# Patient Record
Sex: Female | Born: 1945 | Race: White | Hispanic: No | Marital: Single | Smoking: Never smoker
Health system: Southern US, Community
[De-identification: ages and names within clinical notes are randomized; demographics above are authoritative.]

## PROBLEM LIST (undated history)

## (undated) DIAGNOSIS — E039 Hypothyroidism, unspecified: Secondary | ICD-10-CM

## (undated) DIAGNOSIS — F419 Anxiety disorder, unspecified: Secondary | ICD-10-CM

## (undated) DIAGNOSIS — I10 Essential (primary) hypertension: Secondary | ICD-10-CM

## (undated) DIAGNOSIS — N939 Abnormal uterine and vaginal bleeding, unspecified: Secondary | ICD-10-CM

## (undated) DIAGNOSIS — H269 Unspecified cataract: Secondary | ICD-10-CM

## (undated) DIAGNOSIS — W19XXXA Unspecified fall, initial encounter: Secondary | ICD-10-CM

## (undated) HISTORY — PX: TONSILLECTOMY: SUR1361

## (undated) HISTORY — PX: APPENDECTOMY: SHX54

## (undated) HISTORY — DX: Unspecified cataract: H26.9

---

## 1972-07-06 DIAGNOSIS — F064 Anxiety disorder due to known physiological condition: Secondary | ICD-10-CM | POA: Insufficient documentation

## 2015-12-05 DIAGNOSIS — W19XXXA Unspecified fall, initial encounter: Secondary | ICD-10-CM

## 2015-12-05 HISTORY — DX: Unspecified fall, initial encounter: W19.XXXA

## 2016-01-03 ENCOUNTER — Emergency Department (HOSPITAL_COMMUNITY): Payer: Medicare Other

## 2016-01-03 ENCOUNTER — Encounter (HOSPITAL_COMMUNITY): Payer: Self-pay

## 2016-01-03 ENCOUNTER — Emergency Department (HOSPITAL_COMMUNITY)
Admission: EM | Admit: 2016-01-03 | Discharge: 2016-01-03 | Disposition: A | Discharge status: Home / Self Care | Payer: Medicare Other | Attending: Emergency Medicine | Admitting: Emergency Medicine

## 2016-01-03 DIAGNOSIS — S79911A Unspecified injury of right hip, initial encounter: Secondary | ICD-10-CM | POA: Diagnosis present

## 2016-01-03 DIAGNOSIS — E876 Hypokalemia: Secondary | ICD-10-CM

## 2016-01-03 DIAGNOSIS — Y9389 Activity, other specified: Secondary | ICD-10-CM | POA: Insufficient documentation

## 2016-01-03 DIAGNOSIS — Y999 Unspecified external cause status: Secondary | ICD-10-CM | POA: Insufficient documentation

## 2016-01-03 DIAGNOSIS — Y929 Unspecified place or not applicable: Secondary | ICD-10-CM | POA: Diagnosis not present

## 2016-01-03 DIAGNOSIS — W11XXXA Fall on and from ladder, initial encounter: Secondary | ICD-10-CM | POA: Diagnosis not present

## 2016-01-03 DIAGNOSIS — S3210XA Unspecified fracture of sacrum, initial encounter for closed fracture: Secondary | ICD-10-CM | POA: Diagnosis not present

## 2016-01-03 DIAGNOSIS — S32591A Other specified fracture of right pubis, initial encounter for closed fracture: Secondary | ICD-10-CM

## 2016-01-03 DIAGNOSIS — S32431A Displaced fracture of anterior column [iliopubic] of right acetabulum, initial encounter for closed fracture: Secondary | ICD-10-CM | POA: Diagnosis not present

## 2016-01-03 DIAGNOSIS — S52591A Other fractures of lower end of right radius, initial encounter for closed fracture: Secondary | ICD-10-CM | POA: Diagnosis not present

## 2016-01-03 DIAGNOSIS — S32401A Unspecified fracture of right acetabulum, initial encounter for closed fracture: Secondary | ICD-10-CM

## 2016-01-03 HISTORY — DX: Unspecified fall, initial encounter: W19.XXXA

## 2016-01-03 LAB — CBC
HEMATOCRIT: 32.2 % — AB (ref 36.0–46.0)
Hemoglobin: 9.7 g/dL — ABNORMAL LOW (ref 12.0–15.0)
MCH: 22.8 pg — ABNORMAL LOW (ref 26.0–34.0)
MCHC: 30.1 g/dL (ref 30.0–36.0)
MCV: 75.6 fL — ABNORMAL LOW (ref 78.0–100.0)
PLATELETS: 204 10*3/uL (ref 150–400)
RBC: 4.26 MIL/uL (ref 3.87–5.11)
RDW: 17.4 % — AB (ref 11.5–15.5)
WBC: 12.5 10*3/uL — AB (ref 4.0–10.5)

## 2016-01-03 LAB — BASIC METABOLIC PANEL
ANION GAP: 10 (ref 5–15)
BUN: 12 mg/dL (ref 6–20)
CALCIUM: 9.2 mg/dL (ref 8.9–10.3)
CO2: 23 mmol/L (ref 22–32)
CREATININE: 0.76 mg/dL (ref 0.44–1.00)
Chloride: 107 mmol/L (ref 101–111)
Glucose, Bld: 109 mg/dL — ABNORMAL HIGH (ref 65–99)
Potassium: 3.4 mmol/L — ABNORMAL LOW (ref 3.5–5.1)
SODIUM: 140 mmol/L (ref 135–145)

## 2016-01-03 MED ORDER — POTASSIUM CHLORIDE CRYS ER 20 MEQ PO TBCR
40.0000 meq | EXTENDED_RELEASE_TABLET | Freq: Once | ORAL | Status: AC
  Administered 2016-01-03: 40 meq via ORAL
  Filled 2016-01-03: qty 2

## 2016-01-03 MED ORDER — ACETAMINOPHEN 500 MG PO TABS
1000.0000 mg | ORAL_TABLET | Freq: Once | ORAL | Status: AC
  Administered 2016-01-03: 1000 mg via ORAL
  Filled 2016-01-03: qty 2

## 2016-01-03 MED ORDER — DOCUSATE SODIUM 100 MG PO CAPS: 100.0000 mg | ORAL_CAPSULE | Freq: Two times a day (BID) | ORAL | Status: DC

## 2016-01-03 MED ORDER — OXYCODONE HCL 5 MG PO TABS
5.0000 mg | ORAL_TABLET | Freq: Once | ORAL | Status: AC
  Administered 2016-01-03: 5 mg via ORAL
  Filled 2016-01-03: qty 1

## 2016-01-03 MED ORDER — OXYCODONE HCL 5 MG PO TABS: 5.0000 mg | ORAL_TABLET | Freq: Four times a day (QID) | ORAL | Status: DC | PRN

## 2016-01-03 NOTE — ED Notes (Signed)
Pt transported to and from radiology on stretcher with tech, tolerated well.  Pt reports no pain while lying supine.  Family seated with pt.

## 2016-01-03 NOTE — ED Provider Notes (Signed)
CSN: [id number]     Arrival date & time 01/03/16  0612 History   First MD Initiated Contact with Patient 01/03/16 [phone]     Chief Complaint  Patient presents with  . Fall     (Consider location/radiation/quality/duration/timing/severity/associated sxs/prior Treatment) The history is provided by the patient and medical records. No language interpreter was used.     Betty Sanchez is a 70 y.o. female  with a hx of recurrent falls presents to the Emergency Department complaining of acute, persistent, Right hip pain onset Wednesday after a falling 8 feet off a ladder while power washing.  Pt reports she has been able to ambulate with some pain.  This morning she slipped on a rug and fell, but did not strike her hip and has since had more difficulty bearing weight due to the pain.  Pt has taken ibuprofen with moderate relief.  She took 1 vicodin this AM with increased improvement in pain.  Pt denies numbness, weakness, paresthesias.      Past Medical History  Diagnosis Date  . Fall 12/2015    multiple falls in week    Past Surgical History  Procedure Laterality Date  . Tonsillectomy     No family history on file. Social History  Substance Use Topics  . Smoking status: Never Smoker   . Smokeless tobacco: None  . Alcohol Use: No   OB History    No data available     Review of Systems  Constitutional: Negative for fever, diaphoresis, appetite change, fatigue and unexpected weight change.  HENT: Negative for mouth sores.   Eyes: Negative for visual disturbance.  Respiratory: Negative for cough, chest tightness, shortness of breath and wheezing.   Cardiovascular: Negative for chest pain.  Gastrointestinal: Negative for nausea, vomiting, abdominal pain, diarrhea and constipation.  Endocrine: Negative for polydipsia, polyphagia and polyuria.  Genitourinary: Negative for dysuria, urgency, frequency and hematuria.  Musculoskeletal: Positive for back pain ( right SI joint) and arthralgias  ( right hip). Negative for neck stiffness.  Skin: Negative for rash.  Allergic/Immunologic: Negative for immunocompromised state.  Neurological: Negative for syncope, light-headedness and headaches.  Hematological: Does not bruise/bleed easily.  Psychiatric/Behavioral: Negative for sleep disturbance. The patient is not nervous/anxious.       Allergies  Review of patient's allergies indicates no known allergies.  Home Medications   Prior to Admission medications   Medication Sig Start Date End Date Taking? Authorizing Provider  docusate sodium (COLACE) 100 MG capsule Take 1 capsule (100 mg total) by mouth every 12 (twelve) hours. 01/03/16   Rourke Mcquitty, PA-C  oxyCODONE (ROXICODONE) 5 MG immediate release tablet Take 1 tablet (5 mg total) by mouth every 6 (six) hours as needed for severe pain. 01/03/16   Ally Knodel, PA-C   BP 161/91 mmHg  Pulse 90  Temp(Src) 98 F (36.7 C)  Resp 18  Ht 5\' 7"  (1.702 m)  Wt 77.111 kg  BMI 26.62 kg/m2  SpO2 99% Physical Exam  Constitutional: She appears well-developed and well-nourished. No distress.  Awake, alert, nontoxic appearance  HENT:  Head: Normocephalic and atraumatic.  Mouth/Throat: Oropharynx is clear and moist. No oropharyngeal exudate.  Eyes: Conjunctivae are normal. No scleral icterus.  Neck: Normal range of motion. Neck supple.  Cardiovascular: Normal rate, regular rhythm and intact distal pulses.   Pulmonary/Chest: Effort normal and breath sounds normal. No respiratory distress. She has no wheezes.  Equal chest expansion  Abdominal: Soft. Bowel sounds are normal. She exhibits no mass. There is  no tenderness. There is no rebound and no guarding.  Musculoskeletal: Normal range of motion. She exhibits no edema.  Right hip: almost full ROM with pain; ecchymosis over the right greater trochanter; 5/5 strength with flexion/extension in a lying position; no swelling or palpable deformity RLE: FROM of the right knee, ankle  and foot; sensation intact to dull and sharp throughout; 5/5 with flexion/extension of the knee and ankle  Neurological: She is alert.  Speech is clear and goal oriented Moves extremities without ataxia Gait not tested  Skin: Skin is warm and dry. She is not diaphoretic.  Psychiatric: She has a normal mood and affect.  Nursing note and vitals reviewed.   ED Course  Procedures (including critical care time)  I have personally reviewed and evaluated these images and lab results as part of my medical decision-making.  Results for orders placed or performed during the hospital encounter of 01/03/16  CBC  Result Value Ref Range   WBC 12.5 (H) 4.0 - 10.5 K/uL   RBC 4.26 3.87 - 5.11 MIL/uL   Hemoglobin 9.7 (L) 12.0 - 15.0 g/dL   HCT 32.2 (L) 36.0 - 46.0 %   MCV 75.6 (L) 78.0 - 100.0 fL   MCH 22.8 (L) 26.0 - 34.0 pg   MCHC 30.1 30.0 - 36.0 g/dL   RDW 17.4 (H) 11.5 - 15.5 %   Platelets 204 150 - 400 K/uL  Basic metabolic panel  Result Value Ref Range   Sodium 140 135 - 145 mmol/L   Potassium 3.4 (L) 3.5 - 5.1 mmol/L   Chloride 107 101 - 111 mmol/L   CO2 23 22 - 32 mmol/L   Glucose, Bld 109 (H) 65 - 99 mg/dL   BUN 12 6 - 20 mg/dL   Creatinine, Ser 0.76 0.44 - 1.00 mg/dL   Calcium 9.2 8.9 - 10.3 mg/dL   GFR calc non Af Amer >60 >60 mL/min   GFR calc Af Amer >60 >60 mL/min   Anion gap 10 5 - 15   Dg Lumbar Spine Complete  01/03/2016  CLINICAL DATA:  Fall 2 days ago.  Right low back pain.  Hip pain. EXAM: LUMBAR SPINE - COMPLETE 4+ VIEW COMPARISON:  Right hip radiographs from the same day. FINDINGS: Five non rib-bearing lumbar type vertebral bodies are present. Moderate facet degenerative changes are present at L4-5 and L5-S1. There is chronic loss of disc height at L3-4, L4-5, and L5-S1. No acute fracture traumatic subluxation is present. IMPRESSION: 1. Moderate degenerative changes in the lower lumbar spine. 2. No acute abnormality of the lumbar spine. Electronically Signed   By:  San Morelle M.D.   On: 01/03/2016 08:17   Ct Hip Right Wo Contrast  01/03/2016  CLINICAL DATA:  Right acetabular fracture.  Right leg pain. EXAM: CT OF THE RIGHT HIP WITHOUT CONTRAST TECHNIQUE: Multidetector CT imaging of the right hip was performed according to the standard protocol. Multiplanar CT image reconstructions were also generated. COMPARISON:  None. FINDINGS: Bones/Joint/Cartilage Comminuted right anterior column fracture with 4 mm of displacement along the medial acetabular roof. Comminuted and displaced right inferior pubic ramus fracture. Nondisplaced fracture at the junction of the acetabulum and right superior pubic ramus. Nondisplaced right pubic body fracture. Nondisplaced right sacral ala fracture. No other fracture or dislocation. Mild degenerative changes of the visualize right SI joint. Normal alignment. No aggressive lytic or sclerotic osseous lesion. Mild degenerative disc disease with minimal disc height loss at L4-5 and L5-S1 with bilateral facet arthropathy. Muscles  Normal. Soft tissue No fluid collection or hematoma.  No soft tissue mass. IMPRESSION: 1. Comminuted right anterior column fracture with 4 mm of displaced along the medial acetabular roof. 2. Comminuted and displaced right inferior pubic ramus fracture. Nondisplaced fracture at the junction of the acetabulum and right superior pubic ramus. Nondisplaced right pubic body fracture. 3. Nondisplaced right sacral ala fracture. Electronically Signed   By: Kathreen Devoid   On: 01/03/2016 09:36   Dg Hip Unilat  With Pelvis 2-3 Views Right  01/03/2016  CLINICAL DATA:  Right hip pain after fall 3 days ago. EXAM: DG HIP (WITH OR WITHOUT PELVIS) 2-3V RIGHT COMPARISON:  None. FINDINGS: Nondisplaced fracture is seen involving the right inferior pubic ramus. Mildly displaced fracture is noted involving the superior acetabulum. Right femoral head and neck are unremarkable. No dislocation is noted. IMPRESSION: Nondisplaced fracture  involving right inferior pubic ramus. Mildly displaced fracture involving superior margin of right acetabulum. Electronically Signed   By: Marijo Conception, M.D.   On: 01/03/2016 07:31    MDM   Final diagnoses:  Acetabular fracture, right, closed, initial encounter  Closed fracture of multiple pubic rami, right, initial encounter ([place])   Aliviana Rojero presents with several days of right hip pain after falling approx 8 feet.  Pt able to weight bear until this AM.  Plain film with nondisplaced fracture involving right inferior pubic ramus. Mildly displaced fracture involving superior margin of right acetabulum.  Will CT.  Pt resting comfortably.    10:46 AM CT shows closed acetabular fracture of the medial roof, right inferior pubic ramus fracture and right sacral ala fracture.  Pt seen by Dr. Venora Maples who discussed the imaging with Dr. Percell Miller who recommends touch down weight bearing with a walker and outpatient management.  Discussed with case management for home DME (walker, shower stool, bedside commode).  Home health with deliver equipment to the ED.    The patient was discussed with and seen by Dr. Venora Maples who agrees with the treatment plan.   Betty Soho Ridhi Hoffert, PA-C 01/03/16 [place], MD 01/03/16 [phone]

## 2016-01-03 NOTE — ED Notes (Signed)
Bedside commode placed, pt able to stand, pivot and sit unassisted.  Pt tolerated well.

## 2016-01-03 NOTE — Care Management Note (Signed)
Case Management Note  Patient Details  Name: Betty Sanchez MRN: [id number] Date of Birth: [birth year]  Subjective/Objective:                  a 70 y.o. female with a hx of recurrent falls presents to the Emergency Department complaining of acute, persistent, Right hip pain onset Wednesday after a falling 8 feet off a ladder while power washing./From home alone.  Action/Plan: Follow for disposition needs./ Set up DME.   Expected Discharge Date:      01/03/16            Expected Discharge Plan:  Home/Self Care  In-House Referral:     Discharge planning Services  CM Consult  Post Acute Care Choice:  Durable Medical Equipment Choice offered to:  Patient  DME Arranged:  3-N-1, Shower stool, Walker rolling DME Agency:  [place]:  NA [place] Agency:  NA  Status of Service:  Completed, signed off  If discussed at [place] of Stay Meetings, dates discussed:    Additional Comments: Fuller Mandril, RN, BSN, [place] [phone]. Pt qualifies for DME rolling walker, shower chair and 3n1.  DME  ordered through [place].  Melene Muller of [facility] notified to deliver DME to pt room prior to D/C home.  Fuller Mandril, RN 01/03/2016, 11:01 AM

## 2016-01-03 NOTE — Discharge Instructions (Signed)
1. Medications: Oxycodone as needed for severe or breakthrough pain; tylenol 1g every 6-8 hours (up to 4g in 24 hours) for initial pain control, take colace with oxycodone; usual home medications 2. Treatment: rest, ice, use walker and other home equipment; touch down weight bearing 3. Follow Up: Please followup with orthopedics by calling this afternoon to set up an appointment for discussion of your diagnoses and further evaluation after today's visit; if you do not have a primary care doctor use the resource guide provided to find one; Please return to the ER for worsening symptoms or other concerns

## 2016-01-03 NOTE — ED Notes (Signed)
Pt arrives via EMS for fall x2 this week, one mechanical fall on Wednesday while pressure washing and was able to ambulate on it, then tripped this morning and then had difficulty bearing weight on R hip. No bruising noted.

## 2016-01-03 NOTE — ED Notes (Signed)
Patient transported to X-ray 

## 2016-01-03 NOTE — ED Notes (Signed)
PA at bedside.

## 2017-07-06 DIAGNOSIS — H269 Unspecified cataract: Secondary | ICD-10-CM

## 2017-07-06 HISTORY — PX: CATARACT EXTRACTION W/ INTRAOCULAR LENS  IMPLANT, BILATERAL: SHX1307

## 2017-07-06 HISTORY — DX: Unspecified cataract: H26.9

## 2017-12-10 ENCOUNTER — Telehealth: Payer: Self-pay | Admitting: Family Medicine

## 2017-12-10 NOTE — Telephone Encounter (Signed)
FYI See below message    Copied from [place] [id number]. Topic: Appointment Scheduling - New Patient >> Dec 10, 2017 11:44 AM Ether Griffins B wrote: New patient has been scheduled for your office. Provider: Carlean Purl Date of Appointment: 12/27/17  Route to department's [facility] pool.  >> Dec 10, 2017 12:25 PM Helene Shoe, LPN wrote: Nicole Kindred RN with [facility] said this pt was advised earlier today by surgery center BP  170/120 and pts sister took her to [facility] walk in for eval. This is just FYI.

## 2017-12-10 NOTE — Telephone Encounter (Signed)
Noted  

## 2017-12-10 NOTE — Telephone Encounter (Signed)
Pt has new pt appt on 12/27/17 with Glenda Chroman FNP. FYI to Glenda Chroman FNP.

## 2017-12-10 NOTE — Telephone Encounter (Signed)
Patients sister  Jacqlyn Larsen called giving information  on the patient . Patient has an appointment at June 24 just made with Tor Netters by the [facility] agent   to establish care . Pt was at the surgical center in  [place]  this am for cataract surgery  and her blood pressure was elevated according to agent who took the call it the bp was reported at 237/115 today . And the surgery was rescheduled due to her blood pressure. The sister  took the patient to the [place] clinic and the patient is being checked in to be seen right now. Jacqlyn Larsen is in the car in the parking lot of [facility] giving this information and she stated I have to leave now she is walking out of the bldg. Sister Jacqlyn Larsen then hung up. I called Becky  back and she stated  the patient was  letting her know that they were going to do more more test. The patient and her sister are now in the [place] clinic being evaluated.Spoke  With Rena just to advise her.

## 2017-12-22 ENCOUNTER — Encounter: Payer: Self-pay | Admitting: Family Medicine

## 2017-12-27 ENCOUNTER — Ambulatory Visit: Payer: Medicare Other | Admitting: Family Medicine

## 2017-12-27 ENCOUNTER — Encounter: Payer: Self-pay | Admitting: Family Medicine

## 2017-12-27 VITALS — BP 168/108 | HR 135 | Temp 98.7°F | Ht 64.5 in | Wt 174.0 lb

## 2017-12-27 DIAGNOSIS — D582 Other hemoglobinopathies: Secondary | ICD-10-CM

## 2017-12-27 DIAGNOSIS — F418 Other specified anxiety disorders: Secondary | ICD-10-CM | POA: Diagnosis not present

## 2017-12-27 DIAGNOSIS — I10 Essential (primary) hypertension: Secondary | ICD-10-CM

## 2017-12-27 DIAGNOSIS — Z7689 Persons encountering health services in other specified circumstances: Secondary | ICD-10-CM

## 2017-12-27 MED ORDER — AMLODIPINE BESYLATE 10 MG PO TABS: 10.0000 mg | ORAL_TABLET | Freq: Every day | ORAL | 3 refills | Status: DC

## 2017-12-27 MED ORDER — CLONAZEPAM 1 MG PO TABS: 0.5000 mg | ORAL_TABLET | Freq: Two times a day (BID) | ORAL | 0 refills | Status: DC | PRN

## 2017-12-27 NOTE — Patient Instructions (Addendum)
Apps for relaxation- Calm, Headspace  Take two of your amlodipine 5 mg. I have sent in a new prescription for the 10 mg dose.   Take blood pressure every other day at different times of day  I will notify you of your lab results  Please follow up with me in 9-11 days (next Wed. Or Friday)       How to Take Your Blood Pressure You can take your blood pressure at home with a machine. You may need to check your blood pressure at home:  To check if you have high blood pressure (hypertension).  To check your blood pressure over time.  To make sure your blood pressure medicine is working.  Supplies needed: You will need a blood pressure machine, or monitor. You can buy one at a drugstore or online. When choosing one:  Choose one with an arm cuff.  Choose one that wraps around your upper arm. Only one finger should fit between your arm and the cuff.  Do not choose one that measures your blood pressure from your wrist or finger.  Your doctor can suggest a monitor. How to prepare Avoid these things for 30 minutes before checking your blood pressure:  Drinking caffeine.  Drinking alcohol.  Eating.  Smoking.  Exercising.  Five minutes before checking your blood pressure:  Pee.  Sit in a dining chair. Avoid sitting in a soft couch or armchair.  Be quiet. Do not talk.  How to take your blood pressure Follow the instructions that came with your machine. If you have a digital blood pressure monitor, these may be the instructions: 1. Sit up straight. 2. Place your feet on the floor. Do not cross your ankles or legs. 3. Rest your left arm at the level of your heart. You may rest it on a table, desk, or chair. 4. Pull up your shirt sleeve. 5. Wrap the blood pressure cuff around the upper part of your left arm. The cuff should be 1 inch (2.5 cm) above your elbow. It is best to wrap the cuff around bare skin. 6. Fit the cuff snugly around your arm. You should be able to  place only one finger between the cuff and your arm. 7. Put the cord inside the groove of your elbow. 8. Press the power button. 9. Sit quietly while the cuff fills with air and loses air. 10. Write down the numbers on the screen. 11. Wait 2-3 minutes and then repeat steps 1-10.  What do the numbers mean? Two numbers make up your blood pressure. The first number is called systolic pressure. The second is called diastolic pressure. An example of a blood pressure reading is "120 over 80" (or 120/80). If you are an adult and do not have a medical condition, use this guide to find out if your blood pressure is normal: Normal  First number: below 120.  Second number: below 80. Elevated  First number: 120-129.  Second number: below 80. Hypertension stage 1  First number: 130-139.  Second number: 80-89. Hypertension stage 2  First number: 140 or above.  Second number: 34 or above. Your blood pressure is above normal even if only the top or bottom number is above normal. Follow these instructions at home:  Check your blood pressure as often as your doctor tells you to.  Take your monitor to your next doctor's appointment. Your doctor will: ? Make sure you are using it correctly. ? Make sure it is working right.  Make  sure you understand what your blood pressure numbers should be.  Tell your doctor if your medicines are causing side effects. Contact a doctor if:  Your blood pressure keeps being high. Get help right away if:  Your first blood pressure number is higher than 180.  Your second blood pressure number is higher than 120. This information is not intended to replace advice given to you by your health care provider. Make sure you discuss any questions you have with your health care provider. Document Released: 06/04/2008 Document Revised: 05/20/2016 Document Reviewed: 11/29/2015 Elsevier Interactive Patient Education  [employer].

## 2017-12-27 NOTE — Progress Notes (Signed)
Subjective:    Patient ID: Betty Sanchez, female    DOB: May 28, 1946, 72 y.o.   MRN: [id number]  HPI This is a 72 yo female who presents today to establish care. 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 Enjoys golfing, travel, reading.   She has not had regular primary care. 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   HTN- She was scheduled to have right cataract removed and when she presented to surgery center on 12/10/17, her blood pressure was very elevated and surgery was postponed. She went immediately to urgent care where blood work, EKG and CXR were done. At [facility], her blood pressure was 206/133, HR 120. She was started on amlodipine 5 mg.  She started amlodipine and denies any side effects. She has a home blood pressure monitor and has been monitoring her blood pressure at least daily with readings ranging from 130-160/80-90s.   Anxiety- she reports that she does not typically have anxiety, only related to her health. Fears the unknown.   Last CPE- many years Mammo- unable to tolerate due to anxiety Colonoscopy- never Eye- regular  No headaches, visual changes with cataract, no dizziness, no CP or SOB unless anxiety attack. No abdominal pain, nausea/vomiting/diarrhea, some recent constipation. No muscle or joint pain. Recent sleep disturbance with elevated blood pressure due to anxiety.    Past Medical History:  Diagnosis Date  . Fall 12/2015   multiple falls in week    Past Surgical History:  Procedure Laterality Date  . TONSILLECTOMY     No family history on file. Social History   Tobacco Use  . Smoking status: Never Smoker  . Smokeless tobacco: Never Used  Substance Use Topics  . Alcohol use: No  . Drug use: Never      Review of Systems Per HPI    Objective:   Physical Exam Physical Exam  Constitutional: Oriented to person, place, and time. She appears  well-developed and well-nourished.  HENT:  Head: Normocephalic and atraumatic.  Eyes: Conjunctivae are normal.  Neck: Normal range of motion. Neck supple.  Cardiovascular: tachycardic, regular rhythm and normal heart sounds.   Pulmonary/Chest: Effort normal and breath sounds normal.  Musculoskeletal: Normal range of motion.  Neurological: Alert and oriented to person, place, and time.  Skin: Skin is warm and dry.  Psychiatric: Normal mood and affect. Behavior is normal. Judgment and thought content normal.  Vitals reviewed.     BP (!) 168/108 (BP Location: Right Arm, Patient Position: Sitting, Cuff Size: Large)   Pulse (!) 135   Temp 98.7 F (37.1 C) (Oral)   Ht 5' 4.5" (1.638 m)   Wt 174 lb (78.9 kg)   SpO2 97%   BMI 29.41 kg/m  Wt Readings from Last 3 Encounters:  12/27/17 174 lb (78.9 kg)  01/03/16 170 lb (77.1 kg)   Depression screen [facility] 2/9 12/27/2017  Decreased Interest 0  PHQ - 2 Score 0       Assessment & Plan:  1. Encounter to establish care - reviewed available records in Care Everywhere, nml TSH, HgbA1C, CMET, EKG. Hgb/Hct elevated 17.1/50.4, UA with blood and ketones, CXR- unable to view report but provider note indicates calcifications, right hemidiaphram.  - will follow up on CXR, urine at future visit and address overdue health maintenance  2. Essential hypertension - remains elevated in office, home readings generally WNL. Will increase amlodipine  to 10 mg and have her follow up next week.  - amLODipine (NORVASC) 10 MG tablet; Take 1 tablet (10 mg total) by mouth daily.  Dispense: 90 tablet; Refill: 3 - follow up next week with readings  3. Elevated hemoglobin (HCC) - CBC with Differential - Iron, TIBC and Ferritin Panel  4. Anxiety about health - discussed rare use of benzo for medical visits/procedures - encouraged her to explore mindfulness/meditation - clonazePAM (KLONOPIN) 1 MG tablet; Take 0.5-1 tablets (0.5-1 mg total) by mouth 2 (two) times  daily as needed for anxiety.  Dispense: 15 tablet; Refill: 0   Clarene Reamer, FNP-BC  [facility]  12/28/2017 8:10 AM

## 2017-12-28 ENCOUNTER — Encounter: Payer: Self-pay | Admitting: Family Medicine

## 2017-12-28 LAB — CBC WITH DIFFERENTIAL/PLATELET
BASOS PCT: 0.6 % (ref 0.0–3.0)
Basophils Absolute: 0.1 10*3/uL (ref 0.0–0.1)
EOS ABS: 0 10*3/uL (ref 0.0–0.7)
Eosinophils Relative: 0.4 % (ref 0.0–5.0)
HEMATOCRIT: 47.6 % — AB (ref 36.0–46.0)
Hemoglobin: 16.5 g/dL — ABNORMAL HIGH (ref 12.0–15.0)
LYMPHS ABS: 1.4 10*3/uL (ref 0.7–4.0)
Lymphocytes Relative: 15.7 % (ref 12.0–46.0)
MCHC: 34.7 g/dL (ref 30.0–36.0)
MCV: 89.6 fl (ref 78.0–100.0)
MONO ABS: 0.7 10*3/uL (ref 0.1–1.0)
Monocytes Relative: 7.9 % (ref 3.0–12.0)
Neutro Abs: 6.7 10*3/uL (ref 1.4–7.7)
Neutrophils Relative %: 75.4 % (ref 43.0–77.0)
PLATELETS: 292 10*3/uL (ref 150.0–400.0)
RBC: 5.31 Mil/uL — ABNORMAL HIGH (ref 3.87–5.11)
RDW: 13.4 % (ref 11.5–15.5)
WBC: 8.9 10*3/uL (ref 4.0–10.5)

## 2017-12-28 LAB — IRON,TIBC AND FERRITIN PANEL
%SAT: 22 % (calc) (ref 16–45)
Ferritin: 80 ng/mL (ref 16–288)
IRON: 94 ug/dL (ref 45–160)
TIBC: 418 ug/dL (ref 250–450)

## 2017-12-29 ENCOUNTER — Telehealth: Payer: Self-pay

## 2017-12-29 NOTE — Telephone Encounter (Signed)
Copied from [place] [id number]. Topic: General - Other >> Dec 29, 2017  9:27 AM Ivar Drape wrote: Reason for CRM:   Patient would like the results of the labs she took on 12/27/17.

## 2017-12-30 NOTE — Telephone Encounter (Signed)
Result note created for patient to be called and for info to be released to her Mychart acct.

## 2018-01-05 ENCOUNTER — Encounter: Payer: Self-pay | Admitting: Family Medicine

## 2018-01-05 ENCOUNTER — Ambulatory Visit: Payer: Medicare Other | Admitting: Family Medicine

## 2018-01-05 VITALS — BP 152/84 | HR 124 | Temp 97.9°F | Ht 64.5 in | Wt 175.0 lb

## 2018-01-05 DIAGNOSIS — F418 Other specified anxiety disorders: Secondary | ICD-10-CM

## 2018-01-05 DIAGNOSIS — I10 Essential (primary) hypertension: Secondary | ICD-10-CM | POA: Diagnosis not present

## 2018-01-05 NOTE — Progress Notes (Signed)
p 

## 2018-01-05 NOTE — Progress Notes (Signed)
   Subjective:    Patient ID: Betty Sanchez, female    DOB: 1946-06-17, 72 y.o.   MRN: [id number]  HPI This is a 72 yo female who presents today for follow up of HTN. She brings in her home blood pressure readings which run from 113-155/80-91. Tried 1/2 clonazepam which seemed to help her relax. Feels like she has put everything on hold recently due to her health. Feels fine as always, but is now worried about everything. Tolerating amlodipine well.   Active in church, good family support.    Past Medical History:  Diagnosis Date  . Fall 12/2015   multiple falls in week    Past Surgical History:  Procedure Laterality Date  . TONSILLECTOMY     No family history on file. Social History   Tobacco Use  . Smoking status: Never Smoker  . Smokeless tobacco: Never Used  Substance Use Topics  . Alcohol use: No  . Drug use: Never      Review of Systems Per HPI    Objective:   Physical Exam  Constitutional: She is oriented to person, place, and time. She appears well-developed and well-nourished.  HENT:  Head: Normocephalic and atraumatic.  Eyes: Conjunctivae are normal.  Cardiovascular: Normal rate.  Normal rate with BP recheck.   Pulmonary/Chest: Effort normal.  Neurological: She is alert and oriented to person, place, and time.  Skin: Skin is warm and dry.  Psychiatric: She has a normal mood and affect. Her behavior is normal. Judgment and thought content normal.  Vitals reviewed.     BP (!) 168/90 (BP Location: Left Arm, Patient Position: Sitting, Cuff Size: Large)   Pulse (!) 124   Temp 97.9 F (36.6 C) (Oral)   Ht 5' 4.5" (1.638 m)   Wt 175 lb (79.4 kg)   SpO2 97%   BMI 29.57 kg/m  Wt Readings from Last 3 Encounters:  01/05/18 175 lb (79.4 kg)  12/27/17 174 lb (78.9 kg)  01/03/16 170 lb (77.1 kg)   BP Readings from Last 3 Encounters:  01/05/18 (!) 168/90  12/27/17 (!) 168/108  01/03/16 158/81   BP: (!) 152/84        Assessment & Plan:  1.  Essential hypertension - home readings good, second reading today improved - continue amlodipine 10 mg - discussed checking BP 2/x week, no more - follow up in 1 month- will discuss overdue health maintenance and prioritize  - Eye surgery clearance form completed  2. Anxiety about health - encouraged her to resume normal activities - discussed spare use of clonazepam as needed    Clarene Reamer, FNP-BC  [facility] at [facility], [place]  01/05/2018 1:07 PM

## 2018-01-05 NOTE — Patient Instructions (Signed)
Good to see you today  Follow up in 1 month, sooner if needed  Good luck with your cataract removal

## 2018-02-11 ENCOUNTER — Ambulatory Visit: Payer: Medicare Other | Admitting: Family Medicine

## 2018-02-11 ENCOUNTER — Encounter: Payer: Self-pay | Admitting: Family Medicine

## 2018-02-11 VITALS — BP 166/86 | HR 81 | Temp 97.9°F | Ht 64.5 in | Wt 177.0 lb

## 2018-02-11 DIAGNOSIS — I10 Essential (primary) hypertension: Secondary | ICD-10-CM

## 2018-02-11 DIAGNOSIS — Z719 Counseling, unspecified: Secondary | ICD-10-CM | POA: Diagnosis not present

## 2018-02-11 NOTE — Progress Notes (Signed)
Subjective:    Patient ID: Betty Sanchez, female    DOB: [birth year], 72 y.o.   MRN: [id number]  HPI This is a 72 yo female who presents today for follow up of HTN and over due health maintenance.  Had successful cataract surgery and has second surgery scheduled for next week. Blood pressure was mildly elevated at surgical center prior to surgery. She took clonazepam prior to surgery which she felt was helpful for her anxiety. Has taken 1.5 tablets total. She brings daily BP log with her, blood pressures ranging from 109-143/65-91. No side effects from medication.   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   Has been feeling well, concentration may be off a little, is back to working/traveling as before she had diagnosis of HTN with associated anxiety.   She is interested in exploring recommended health maintenance but is concerned about extreme anxiety that interacting with health care causes. She denies anxiety in any other aspect of her life.    Past Medical History:  Diagnosis Date  . Fall 12/2015   multiple falls in week    Past Surgical History:  Procedure Laterality Date  . TONSILLECTOMY     No family history on file. Social History   Tobacco Use  . Smoking status: Never Smoker  . Smokeless tobacco: Never Used  Substance Use Topics  . Alcohol use: No  . Drug use: Never      Review of Systems Per HPI    Objective:   Physical Exam Physical Exam  Constitutional: Oriented to person, place, and time. She appears well-developed and well-nourished.  HENT:  Head: Normocephalic and atraumatic.  Eyes: Conjunctivae are normal.  Neck: Normal range of motion. Neck supple.  Cardiovascular: Normal rate, regular rhythm and normal heart sounds.   Pulmonary/Chest: Effort normal and breath sounds normal.  Musculoskeletal: Normal range of motion.  Neurological: Alert and oriented to person, place, and  time.  Skin: Skin is warm and dry.  Psychiatric: Normal mood and affect. Behavior is normal. Judgment and thought content normal.  Vitals reviewed.     BP (!) 166/86 (BP Location: Left Arm, Patient Position: Sitting, Cuff Size: Large)   Pulse 81   Temp 97.9 F (36.6 C) (Oral)   Ht 5' 4.5" (1.638 m)   Wt 177 lb (80.3 kg)   SpO2 99%   BMI 29.91 kg/m  Wt Readings from Last 3 Encounters:  02/11/18 177 lb (80.3 kg)  01/05/18 175 lb (79.4 kg)  12/27/17 174 lb (78.9 kg)       Assessment & Plan:  1. Essential hypertension - mildly elevated in office today but she has log of home readings that demonstrate good control - continue amlodipine  - she will continue to take home blood pressures, can check weekly, she was provided parameters for notifying me  2. Health counseling - Discussed overdue health maintenance and recommendations and guidelines for mammogram/bone density, cervical cancer and colon cancer screening (colonscopy and Cologuard), Tdap, vaccination against pneumonia - Patient will consider screenings and do her own research and weight benefits of screening with anxiety they will cause - she agrees to return in 6 months for CPE with breast exam and possible pap test and to discuss further screening  Over 30 minutes were spent face-to-face with the patient during this encounter and >50% of that time was spent on counseling and coordination of care  Clarene Reamer, FNP-BC  [facility]  at [facility], [facility]  02/12/2018 6:39 AM

## 2018-02-11 NOTE — Patient Instructions (Signed)
Please check your blood pressure no more than once a week  If it is running more than 150/90 more than half the time or over 180/110 please let me know  Please schedule a follow up visit for a complete physical in 6 months

## 2018-02-12 ENCOUNTER — Encounter: Payer: Self-pay | Admitting: Family Medicine

## 2018-08-28 ENCOUNTER — Other Ambulatory Visit: Payer: Self-pay | Admitting: Family Medicine

## 2018-08-28 DIAGNOSIS — D582 Other hemoglobinopathies: Secondary | ICD-10-CM

## 2018-08-28 DIAGNOSIS — E6609 Other obesity due to excess calories: Secondary | ICD-10-CM

## 2018-08-28 DIAGNOSIS — Z1159 Encounter for screening for other viral diseases: Secondary | ICD-10-CM

## 2018-08-28 DIAGNOSIS — I10 Essential (primary) hypertension: Secondary | ICD-10-CM

## 2018-08-28 NOTE — Progress Notes (Signed)
Labs entered for CPE.  

## 2018-09-02 ENCOUNTER — Ambulatory Visit (INDEPENDENT_AMBULATORY_CARE_PROVIDER_SITE_OTHER): Payer: Medicare Other

## 2018-09-02 VITALS — BP 158/96 | HR 128 | Temp 97.5°F | Ht 65.0 in | Wt 176.7 lb

## 2018-09-02 DIAGNOSIS — Z0000 Encounter for general adult medical examination without abnormal findings: Secondary | ICD-10-CM

## 2018-09-02 DIAGNOSIS — Z1159 Encounter for screening for other viral diseases: Secondary | ICD-10-CM

## 2018-09-02 DIAGNOSIS — D582 Other hemoglobinopathies: Secondary | ICD-10-CM

## 2018-09-02 DIAGNOSIS — I10 Essential (primary) hypertension: Secondary | ICD-10-CM

## 2018-09-02 DIAGNOSIS — E6609 Other obesity due to excess calories: Secondary | ICD-10-CM

## 2018-09-02 LAB — COMPREHENSIVE METABOLIC PANEL
ALK PHOS: 64 U/L (ref 39–117)
ALT: 13 U/L (ref 0–35)
AST: 16 U/L (ref 0–37)
Albumin: 4.8 g/dL (ref 3.5–5.2)
BILIRUBIN TOTAL: 0.8 mg/dL (ref 0.2–1.2)
BUN: 11 mg/dL (ref 6–23)
CALCIUM: 9.9 mg/dL (ref 8.4–10.5)
CO2: 24 meq/L (ref 19–32)
CREATININE: 0.68 mg/dL (ref 0.40–1.20)
Chloride: 104 mEq/L (ref 96–112)
GFR: 85.25 mL/min (ref >60.00)
GLUCOSE: 114 mg/dL — AB (ref 70–99)
Potassium: 4.1 mEq/L (ref 3.5–5.1)
Sodium: 141 mEq/L (ref 135–145)
TOTAL PROTEIN: 7.9 g/dL (ref 6.0–8.3)

## 2018-09-02 LAB — CBC WITH DIFFERENTIAL/PLATELET
BASOS ABS: 0.1 10*3/uL (ref 0.0–0.1)
Basophils Relative: 0.9 % (ref 0.0–3.0)
EOS ABS: 0.1 10*3/uL (ref 0.0–0.7)
Eosinophils Relative: 0.7 % (ref 0.0–5.0)
HCT: 46.8 % — ABNORMAL HIGH (ref 36.0–46.0)
Hemoglobin: 16.2 g/dL — ABNORMAL HIGH (ref 12.0–15.0)
LYMPHS ABS: 2.1 10*3/uL (ref 0.7–4.0)
LYMPHS PCT: 29.9 % (ref 12.0–46.0)
MCHC: 34.6 g/dL (ref 30.0–36.0)
MCV: 88.8 fl (ref 78.0–100.0)
Monocytes Absolute: 0.6 10*3/uL (ref 0.1–1.0)
Monocytes Relative: 8.4 % (ref 3.0–12.0)
NEUTROS ABS: 4.2 10*3/uL (ref 1.4–7.7)
NEUTROS PCT: 60.1 % (ref 43.0–77.0)
PLATELETS: 314 10*3/uL (ref 150.0–400.0)
RBC: 5.27 Mil/uL — ABNORMAL HIGH (ref 3.87–5.11)
RDW: 13.5 % (ref 11.5–15.5)
WBC: 7 10*3/uL (ref 4.0–10.5)

## 2018-09-02 LAB — TSH: TSH: 6.66 u[IU]/mL — AB (ref 0.35–4.50)

## 2018-09-02 LAB — LIPID PANEL
CHOL/HDL RATIO: 3
Cholesterol: 245 mg/dL — ABNORMAL HIGH (ref 0–200)
HDL: 71.9 mg/dL (ref >39.00)
LDL Cholesterol: 148 mg/dL — ABNORMAL HIGH (ref 0–99)
NonHDL: 172.92
TRIGLYCERIDES: 125 mg/dL (ref 0.0–149.0)
VLDL: 25 mg/dL (ref 0.0–40.0)

## 2018-09-02 NOTE — Progress Notes (Signed)
PCP notes:   Health maintenance:  Hep C screening - completed Mammogram - PCP follow-up requested Colonoscopy - PCP follow-up requested Bone density - pt declined Tetanus vaccine - postponed/insurance  Abnormal screenings:   None  Patient concerns:   None  Nurse concerns:  None  Next PCP appt:   09/07/18 @ 0830

## 2018-09-02 NOTE — Progress Notes (Signed)
Subjective:   Betty Sanchez is a 73 y.o. female who presents for an Initial Medicare Annual Wellness Visit.  Review of Systems    N/A  Cardiac Risk Factors include: advanced age (>82men, >47 women);hypertension     Objective:    Today's Vitals   09/02/18 1211  BP: (!) 158/96  Pulse: (!) 128  Temp: (!) 97.5 F (36.4 C)  TempSrc: Oral  SpO2: 99%  Weight: 176 lb 11.2 oz (80.2 kg)  Height: 5\' 5"  (1.651 m)  PainSc: 0-No pain   Body mass index is 29.4 kg/m.  Advanced Directives 01/03/2016  Does Patient Have a Medical Advance Directive? Yes  Type of Paramedic of [place];Living will;Out of facility DNR (pink MOST or yellow form)  Copy of [place] in Chart? Yes  Would patient like information on creating a medical advance directive? No - patient declined information    Current Medications (verified) Outpatient Encounter Medications as of 09/02/2018  Medication Sig  . amLODipine (NORVASC) 10 MG tablet Take 1 tablet (10 mg total) by mouth daily.  . clonazePAM (KLONOPIN) 1 MG tablet Take 0.5-1 tablets (0.5-1 mg total) by mouth 2 (two) times daily as needed for anxiety.  . [DISCONTINUED] PROLENSA 0.07 % SOLN INT 1 GTT IN OD HS UTD   No facility-administered encounter medications on file as of 09/02/2018.     Allergies (verified) Patient has no known allergies.   History: Past Medical History:  Diagnosis Date  . Cataract 2019   bilateral eyes; corrected with surgery  . Fall 12/2015   multiple falls in week    Past Surgical History:  Procedure Laterality Date  . CATARACT EXTRACTION W/ INTRAOCULAR LENS  IMPLANT, BILATERAL  2019  . TONSILLECTOMY     History reviewed. No pertinent family history. Social History   Socioeconomic History  . Marital status: Single    Spouse name: Not on file  . Number of children: Not on file  . Years of education: Not on file  . Highest education level: Not on file  Occupational History  .  Not on file  Social Needs  . Financial resource strain: Not on file  . Food insecurity:    Worry: Not on file    Inability: Not on file  . Transportation needs:    Medical: Not on file    Non-medical: Not on file  Tobacco Use  . Smoking status: Never Smoker  . Smokeless tobacco: Never Used  Substance and Sexual Activity  . Alcohol use: No  . Drug use: Never  . Sexual activity: Not Currently  Lifestyle  . Physical activity:    Days per week: Not on file    Minutes per session: Not on file  . Stress: Not on file  Relationships  . Social connections:    Talks on phone: Not on file    Gets together: Not on file    Attends religious service: Not on file    Active member of club or organization: Not on file    Attends meetings of clubs or organizations: Not on file    Relationship status: Not on file  Other Topics Concern  . Not on file  Social History Narrative  . Not on file    Tobacco Counseling Counseling given: No   Clinical Intake:  Pre-visit preparation completed: Yes  Pain : No/denies pain Pain Score: 0-No pain     Nutritional Status: BMI 25 -29 Overweight Nutritional Risks: None Diabetes: No  How often do you need to have someone help you when you read instructions, pamphlets, or other written materials from your doctor or pharmacy?: 1 - Never What is the last grade level you completed in school?: Doctorate degree  Interpreter Needed?: No  Comments: pt lives alone Information entered by :: LPinson, LPN   Activities of Daily Living In your present state of health, do you have any difficulty performing the following activities: 09/02/2018  Hearing? N  Vision? N  Difficulty concentrating or making decisions? N  Walking or climbing stairs? N  Dressing or bathing? N  Doing errands, shopping? N  Preparing Food and eating ? N  Using the Toilet? N  In the past six months, have you accidently leaked urine? N  Do you have problems with loss of bowel  control? N  Managing your Medications? N  Managing your Finances? N  Housekeeping or managing your Housekeeping? N  Some recent data might be hidden     Immunizations and Health Maintenance  There is no immunization history on file for this patient. There are no preventive care reminders to display for this patient.  Patient Care Team: Elby Beck, FNP as PCP - General (Nurse Practitioner)  Indicate any recent Medical Services you may have received from other than [facility] providers in the past year (date may be approximate).     Assessment:   This is a routine wellness examination for Betty Sanchez.  Hearing/Vision screen  Hearing Screening   125Hz  250Hz  500Hz  1000Hz  2000Hz  3000Hz  4000Hz  6000Hz  8000Hz   Right ear:   40 40 40  40    Left ear:   40 40 40  40    Vision Screening Comments: Vision exam in 2019  Dietary issues and exercise activities discussed: Current Exercise Habits: The patient does not participate in regular exercise at present, Exercise limited by: None identified  Goals    . Patient Stated     Starting 09/02/2018, I will continue to exercise for 75 minutes twice weekly.       Depression Screen PHQ 2/9 Scores 09/02/2018 12/27/2017  PHQ - 2 Score 0 0  PHQ- 9 Score 0 -    Fall Risk Fall Risk  09/02/2018 12/27/2017  Falls in the past year? 0 No   Cognitive Function: MMSE - Mini Mental State Exam 09/02/2018  Orientation to time 5  Orientation to Place 5  Registration 3  Attention/ Calculation 0  Recall 3  Language- name 2 objects 0  Language- repeat 1  Language- follow 3 step command 3  Language- read & follow direction 0  Write a sentence 0  Copy design 0  Total score 20       PLEASE NOTE: A Mini-Cog screen was completed. Maximum score is 20. A value of 0 denotes this part of Folstein MMSE was not completed or the patient failed this part of the Mini-Cog screening.   Mini-Cog Screening Orientation to Time - Max 5 pts Orientation to Place - Max 5  pts Registration - Max 3 pts Recall - Max 3 pts Language Repeat - Max 1 pts Language Follow 3 Step Command - Max 3 pts   Screening Tests Health Maintenance  Topic Date Due  . INFLUENZA VACCINE  10/04/2018 (Originally 02/03/2018)  . MAMMOGRAM  07/06/2019 (Originally 06/29/1997)  . COLONOSCOPY  07/06/2019 (Originally 06/29/1997)  . DEXA SCAN  07/05/2020 (Originally 06/29/2012)  . TETANUS/TDAP  07/05/2020 (Originally 06/29/1966)  . PNA vac Low Risk Adult (1 of 2 - PCV13)  07/05/2049 (Originally 06/29/2012)  . Hepatitis C Screening  Completed      Plan:     I have personally reviewed, addressed, and noted the following in the patient's chart:  A. Medical and social history B. Use of alcohol, tobacco or illicit drugs  C. Current medications and supplements D. Functional ability and status E.  Nutritional status F.  Physical activity G. Advance directives H. List of other physicians I.  Hospitalizations, surgeries, and ER visits in previous 12 months J.  [place] to include hearing, vision, cognitive, depression L. Referrals and appointments - none  In addition, I have reviewed and discussed with patient certain preventive protocols, quality metrics, and best practice recommendations. A written personalized care plan for preventive services as well as general preventive health recommendations were provided to patient.  See attached scanned questionnaire for additional information.   Signed,   Lindell Noe, MHA, BS, LPN Health Coach

## 2018-09-02 NOTE — Patient Instructions (Signed)
Betty Sanchez , Thank you for taking time to come for your Medicare Wellness Visit. I appreciate your ongoing commitment to your health goals. Please review the following plan we discussed and let me know if I can assist you in the future.   These are the goals we discussed: Goals    . Patient Stated     Starting 09/02/2018, I will continue to exercise for 75 minutes twice weekly.        This is a list of the screening recommended for you and due dates:  Health Maintenance  Topic Date Due  . Flu Shot  10/04/2018*  . Mammogram  07/06/2019*  . Colon Cancer Screening  07/06/2019*  . DEXA scan (bone density measurement)  07/05/2020*  . Tetanus Vaccine  07/05/2020*  . Pneumonia vaccines (1 of 2 - PCV13) 07/05/2049*  .  Hepatitis C: One time screening is recommended by Center for Disease Control  (CDC) for  adults born from [birth year] through 1964.   Completed  *Topic was postponed. The date shown is not the original due date.   Preventive Care for Adults  A healthy lifestyle and preventive care can promote health and wellness. Preventive health guidelines for adults include the following key practices.  . A routine yearly physical is a good way to check with your health care provider about your health and preventive screening. It is a chance to share any concerns and updates on your health and to receive a thorough exam.  . Visit your dentist for a routine exam and preventive care every 6 months. Brush your teeth twice a day and floss once a day. Good oral hygiene prevents tooth decay and gum disease.  . The frequency of eye exams is based on your age, health, family medical history, use  of contact lenses, and other factors. Follow your health care provider's recommendations for frequency of eye exams.  . Eat a healthy diet. Foods like vegetables, fruits, whole grains, low-fat dairy products, and lean protein foods contain the nutrients you need without too many calories. Decrease your intake of  foods high in solid fats, added sugars, and salt. Eat the right amount of calories for you. Get information about a proper diet from your health care provider, if necessary.  . Regular physical exercise is one of the most important things you can do for your health. Most adults should get at least 150 minutes of moderate-intensity exercise (any activity that increases your heart rate and causes you to sweat) each week. In addition, most adults need muscle-strengthening exercises on 2 or more days a week.  Silver Sneakers may be a benefit available to you. To determine eligibility, you may visit the website: www.silversneakers.com or contact program at 212-360-5737 Mon-Fri between 8AM-8PM.   . Maintain a healthy weight. The body mass index (BMI) is a screening tool to identify possible weight problems. It provides an estimate of body fat based on height and weight. Your health care provider can find your BMI and can help you achieve or maintain a healthy weight.   For adults 20 years and older: ? A BMI below 18.5 is considered underweight. ? A BMI of 18.5 to 24.9 is normal. ? A BMI of 25 to 29.9 is considered overweight. ? A BMI of 30 and above is considered obese.   . Maintain normal blood lipids and cholesterol levels by exercising and minimizing your intake of saturated fat. Eat a balanced diet with plenty of fruit and vegetables. Blood tests for  lipids and cholesterol should begin at age 58 and be repeated every 5 years. If your lipid or cholesterol levels are high, you are over 50, or you are at high risk for heart disease, you may need your cholesterol levels checked more frequently. Ongoing high lipid and cholesterol levels should be treated with medicines if diet and exercise are not working.  . If you smoke, find out from your health care provider how to quit. If you do not use tobacco, please do not start.  . If you choose to drink alcohol, please do not consume more than 2 drinks per  day. One drink is considered to be 12 ounces (355 mL) of beer, 5 ounces (148 mL) of wine, or 1.5 ounces (44 mL) of liquor.  . If you are 74-47 years old, ask your health care provider if you should take aspirin to prevent strokes.  . Use sunscreen. Apply sunscreen liberally and repeatedly throughout the day. You should seek shade when your shadow is shorter than you. Protect yourself by wearing long sleeves, pants, a wide-brimmed hat, and sunglasses year round, whenever you are outdoors.  . Once a month, do a whole body skin exam, using a mirror to look at the skin on your back. Tell your health care provider of new moles, moles that have irregular borders, moles that are larger than a pencil eraser, or moles that have changed in shape or color.

## 2018-09-04 NOTE — Progress Notes (Signed)
I reviewed health advisor's note, was available for consultation, and agree with documentation and plan.  

## 2018-09-05 LAB — HEPATITIS C ANTIBODY
HEP C AB: NONREACTIVE
SIGNAL TO CUT-OFF: 0.01 (ref <1.00)

## 2018-09-07 ENCOUNTER — Encounter: Payer: Self-pay | Admitting: Family Medicine

## 2018-09-07 ENCOUNTER — Ambulatory Visit (INDEPENDENT_AMBULATORY_CARE_PROVIDER_SITE_OTHER): Payer: Medicare Other | Admitting: Family Medicine

## 2018-09-07 VITALS — BP 142/94 | HR 90 | Temp 97.7°F | Ht 64.5 in | Wt 176.8 lb

## 2018-09-07 DIAGNOSIS — I10 Essential (primary) hypertension: Secondary | ICD-10-CM | POA: Diagnosis not present

## 2018-09-07 DIAGNOSIS — Z0000 Encounter for general adult medical examination without abnormal findings: Secondary | ICD-10-CM | POA: Diagnosis not present

## 2018-09-07 DIAGNOSIS — R7989 Other specified abnormal findings of blood chemistry: Secondary | ICD-10-CM | POA: Diagnosis not present

## 2018-09-07 DIAGNOSIS — Z789 Other specified health status: Secondary | ICD-10-CM

## 2018-09-07 NOTE — Patient Instructions (Signed)
Good to see you today  Keeping You Healthy  Get These Tests  Blood Pressure- Have your blood pressure checked by your healthcare provider at least once a year.  Normal blood pressure is 120/80.  Weight- Have your body mass index (BMI) calculated to screen for obesity.  BMI is a measure of body fat based on height and weight.  You can calculate your own BMI at [employer]velB[employer].it  Cholesterol- Have your cholesterol checked every year.  Diabetes- Have your blood sugar checked every year if you have high blood pressure, high cholesterol, a family history of diabetes or if you are overweight.  Pap Test - Have a pap test every 1 to 5 years if you have been sexually active.  If you are older than 65 and recent pap tests have been normal you may not need additional pap tests.  In addition, if you have had a hysterectomy  for benign disease additional pap tests are not necessary.  Mammogram-Yearly mammograms are essential for early detection of breast cancer  Screening for Colon Cancer- Colonoscopy starting at age 8. Screening may begin sooner depending on your family history and other health conditions.  Follow up colonoscopy as directed by your Gastroenterologist.  Screening for Osteoporosis- Screening begins at age 25 with bone density scanning, sooner if you are at higher risk for developing Osteoporosis.  Get these medicines  Calcium with Vitamin D- Your body requires 1200-1500 mg of Calcium a day and [phone] IU of Vitamin D a day.  You can only absorb 500 mg of Calcium at a time therefore Calcium must be taken in 2 or 3 separate doses throughout the day.  Hormones- Hormone therapy has been associated with increased risk for certain cancers and heart disease.  Talk to your healthcare provider about if you need relief from menopausal symptoms.  Aspirin- Ask your healthcare provider about taking Aspirin to prevent Heart Disease and Stroke.  Get these Immuniztions  Flu shot- Every  fall  Pneumonia shot- Once after the age of 11; if you are younger ask your healthcare provider if you need a pneumonia shot.  Tetanus- Every ten years.  Zostavax- Once after the age of 46 to prevent shingles.  Take these steps  Don't smoke- Your healthcare provider can help you quit. For tips on how to quit, ask your healthcare provider or go to www.smokefree.gov or call 1-800 QUIT-NOW.  Be physically active- Exercise 5 days a week for a minimum of 30 minutes.  If you are not already physically active, start slow and gradually work up to 30 minutes of moderate physical activity.  Try walking, dancing, bike riding, swimming, etc.  Eat a healthy diet- Eat a variety of healthy foods such as fruits, vegetables, whole grains, low fat milk, low fat cheeses, yogurt, lean meats, chicken, fish, eggs, dried beans, tofu, etc.  For more information go to www.thenutritionsource.org  Dental visit- Brush and floss teeth twice daily; visit your dentist twice a year.  Eye exam- Visit your Optometrist or Ophthalmologist yearly.  Drink alcohol in moderation- Limit alcohol intake to one drink or less a day.  Never drink and drive.  Depression- Your emotional health is as important as your physical health.  If you're feeling down or losing interest in things you normally enjoy, please talk to your healthcare provider.  Seat Belts- can save your life; always wear one  Smoke/Carbon Monoxide detectors- These detectors need to be installed on the appropriate level of your home.  Replace batteries at least  once a year.  Violence- If anyone is threatening or hurting you, please tell your healthcare provider.  Living Will/ Health care power of attorney- Discuss with your healthcare provider and family.

## 2018-09-07 NOTE — Progress Notes (Signed)
Subjective:    Patient ID: Betty Sanchez, female    DOB: 1946-05-10, 73 y.o.   MRN: [id number]  HPI This is a 73 yo female who presents today for CPE she has been doing well.  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  She started to think about what retirement will look like for her.  She had AWV 09/02/18.  Last CPE-many years ago Mammo-never, declines at this time, declines clinical breast exam Pap-never, declines at this time Colonoscopy-never, declines at this time Tdap- unknown Flu-declined Eye-regular within the last year, had bilateral cataract removal Dental-regular, currently having some periodontal work performed Exercise-she goes to [employer] several times a week and tries to exercise regularly when she is on the road  Essential HTN-she brings a record of her blood pressure readings from home.  Numbers range from the 110s- 140s over 70s to low 90s.  She is currently taking amlodipine 10 mg and denies any side effects.  Past Medical History:  Diagnosis Date  . Cataract 2019   bilateral eyes; corrected with surgery  . Fall 12/2015   multiple falls in week    Past Surgical History:  Procedure Laterality Date  . CATARACT EXTRACTION W/ INTRAOCULAR LENS  IMPLANT, BILATERAL  2019  . TONSILLECTOMY     No family history on file. Social History   Tobacco Use  . Smoking status: Never Smoker  . Smokeless tobacco: Never Used  Substance Use Topics  . Alcohol use: No  . Drug use: Never      Review of Systems  Constitutional: Negative.   HENT: Negative.   Eyes: Negative.   Respiratory: Negative.   Cardiovascular: Negative.   Gastrointestinal: Negative.   Endocrine: Negative.   Genitourinary: Negative.   Musculoskeletal: Negative.   Skin: Negative.   Allergic/Immunologic: Negative.   Neurological: Negative.   Hematological:  Negative.   Psychiatric/Behavioral: Negative.        Objective:   Physical Exam Constitutional:      General: She is not in acute distress.    Appearance: Normal appearance. She is normal weight. She is not ill-appearing, toxic-appearing or diaphoretic.  HENT:     Head: Normocephalic and atraumatic.     Nose: Nose normal.     Mouth/Throat:     Mouth: Mucous membranes are moist.     Pharynx: Oropharynx is clear.  Eyes:     Conjunctiva/sclera: Conjunctivae normal.  Neck:     Musculoskeletal: Normal range of motion and neck supple. Muscular tenderness present. No neck rigidity.  Cardiovascular:     Rate and Rhythm: Normal rate and regular rhythm.     Heart sounds: Normal heart sounds.  Pulmonary:     Breath sounds: Normal breath sounds.  Musculoskeletal:     Right lower leg: No edema.     Left lower leg: No edema.  Lymphadenopathy:     Cervical: No cervical adenopathy.  Skin:    General: Skin is warm and dry.  Neurological:     Mental Status: She is alert and oriented to person, place, and time.  Psychiatric:        Mood and Affect: Mood normal.        Behavior: Behavior normal.        Thought Content: Thought content normal.        Judgment: Judgment normal.  Patient declined putting on gown for physical exam    BP (!) 142/94   Pulse 90   Temp 97.7 F (36.5 C) (Oral)   Ht 5' 4.5" (1.638 m)   Wt 176 lb 12 oz (80.2 kg)   SpO2 97%   BMI 29.87 kg/m  Wt Readings from Last 3 Encounters:  09/07/18 176 lb 12 oz (80.2 kg)  09/02/18 176 lb 11.2 oz (80.2 kg)  02/11/18 177 lb (80.3 kg)   BP Readings from Last 3 Encounters:  09/07/18 (!) 142/94  09/02/18 (!) 158/96  02/11/18 (!) 166/86   Depression screen PHQ 2/9 09/02/2018 12/27/2017  Decreased Interest 0 0  Down, Depressed, Hopeless 0 -  PHQ - 2 Score 0 0  Altered sleeping 0 -  Tired, decreased energy 0 -  Change in appetite 0 -  Feeling bad or failure about yourself  0 -  Trouble concentrating 0 -  Moving  slowly or fidgety/restless 0 -  Suicidal thoughts 0 -  PHQ-9 Score 0 -  Difficult doing work/chores Not difficult at all -       Assessment & Plan:  1. Annual physical exam - Discussed and encouraged healthy lifestyle choices- adequate sleep, regular exercise, stress management and healthy food choices.  -Reviewed lab results  2. Essential hypertension - well controlled on current medication, continue periodic home monitoring  3. Health maintenance alteration - have discussed screening colonoscopy, mammogram, pap with patient at several visits. She is not currently interested in pursing screening but will consider further discussion at next visit  4. Elevated TSH - discussed mildly elevated TSH (6.66) and additional work-up including free T4, T3 in the future -She denies any current symptoms of hypothyroidism and would like to readdress this at her visit next year -Reviewed symptoms and encouraged her to follow-up if she experiences any weight gain, constipation, skin changes, hair loss, fatigue  -Follow-up in 1 year, sooner if needed   Clarene Reamer, FNP-BC  [facility], [place]  09/08/2018 7:47 AM

## 2018-09-08 ENCOUNTER — Encounter: Payer: Self-pay | Admitting: Family Medicine

## 2018-09-08 DIAGNOSIS — I10 Essential (primary) hypertension: Secondary | ICD-10-CM | POA: Insufficient documentation

## 2018-12-18 ENCOUNTER — Other Ambulatory Visit: Payer: Self-pay | Admitting: Family Medicine

## 2018-12-18 DIAGNOSIS — I10 Essential (primary) hypertension: Secondary | ICD-10-CM

## 2019-08-04 ENCOUNTER — Ambulatory Visit: Payer: Medicare PPO

## 2019-08-09 ENCOUNTER — Ambulatory Visit: Payer: Medicare Other

## 2019-08-11 ENCOUNTER — Ambulatory Visit: Payer: Medicare PPO | Attending: Internal Medicine

## 2019-08-11 DIAGNOSIS — Z23 Encounter for immunization: Secondary | ICD-10-CM

## 2019-08-11 NOTE — Progress Notes (Signed)
   Covid-19 Vaccination Clinic  Name:  Betty Sanchez    MRN: [id number] DOB: 18-Aug-1945  08/11/2019  Betty Sanchez was observed post Covid-19 immunization for 15 minutes without incidence. She was provided with Vaccine Information Sheet and instruction to access the V-Safe system.   Betty Sanchez was instructed to call 911 with any severe reactions post vaccine: Marland Kitchen Difficulty breathing  . Swelling of your face and throat  . A fast heartbeat  . A bad rash all over your body  . Dizziness and weakness    Immunizations Administered    Name Date Dose VIS Date Route   Pfizer COVID-19 Vaccine 08/11/2019  9:18 AM 0.3 mL 06/16/2019 Intramuscular   Manufacturer: [place]   Lot: [id number]   [place]: [id number]

## 2019-09-04 ENCOUNTER — Other Ambulatory Visit: Payer: Self-pay | Admitting: Family Medicine

## 2019-09-04 DIAGNOSIS — R7989 Other specified abnormal findings of blood chemistry: Secondary | ICD-10-CM

## 2019-09-04 DIAGNOSIS — R7309 Other abnormal glucose: Secondary | ICD-10-CM

## 2019-09-04 DIAGNOSIS — E7800 Pure hypercholesterolemia, unspecified: Secondary | ICD-10-CM

## 2019-09-04 DIAGNOSIS — I10 Essential (primary) hypertension: Secondary | ICD-10-CM

## 2019-09-05 ENCOUNTER — Ambulatory Visit: Payer: Medicare PPO | Attending: Internal Medicine

## 2019-09-05 DIAGNOSIS — Z23 Encounter for immunization: Secondary | ICD-10-CM | POA: Insufficient documentation

## 2019-09-05 NOTE — Progress Notes (Signed)
   Covid-19 Vaccination Clinic  Name:  Betty Sanchez    MRN: [id number] DOB: 1946-05-23  09/05/2019  Betty Sanchez was observed post Covid-19 immunization for 15 minutes without incident. She was provided with Vaccine Information Sheet and instruction to access the V-Safe system.   Betty Sanchez was instructed to call 911 with any severe reactions post vaccine: Marland Kitchen Difficulty breathing  . Swelling of face and throat  . A fast heartbeat  . A bad rash all over body  . Dizziness and weakness   Immunizations Administered    Name Date Dose VIS Date Route   Pfizer COVID-19 Vaccine 09/05/2019  9:00 AM 0.3 mL 06/16/2019 Intramuscular   Manufacturer: [place]   Lot: [id number]   [place]: [id number]

## 2019-09-11 ENCOUNTER — Other Ambulatory Visit: Payer: Self-pay

## 2019-09-11 ENCOUNTER — Other Ambulatory Visit (INDEPENDENT_AMBULATORY_CARE_PROVIDER_SITE_OTHER): Payer: Medicare PPO

## 2019-09-11 DIAGNOSIS — I10 Essential (primary) hypertension: Secondary | ICD-10-CM | POA: Diagnosis not present

## 2019-09-11 DIAGNOSIS — R739 Hyperglycemia, unspecified: Secondary | ICD-10-CM

## 2019-09-11 DIAGNOSIS — E7800 Pure hypercholesterolemia, unspecified: Secondary | ICD-10-CM

## 2019-09-11 DIAGNOSIS — R7309 Other abnormal glucose: Secondary | ICD-10-CM | POA: Diagnosis not present

## 2019-09-11 DIAGNOSIS — R7989 Other specified abnormal findings of blood chemistry: Secondary | ICD-10-CM

## 2019-09-11 LAB — BASIC METABOLIC PANEL
BUN: 15 mg/dL (ref 6–23)
CO2: 24 mEq/L (ref 19–32)
Calcium: 9.9 mg/dL (ref 8.4–10.5)
Chloride: 104 mEq/L (ref 96–112)
Creatinine, Ser: 0.72 mg/dL (ref 0.40–1.20)
GFR: 79.58 mL/min (ref >60.00)
Glucose, Bld: 112 mg/dL — ABNORMAL HIGH (ref 70–99)
Potassium: 4.1 mEq/L (ref 3.5–5.1)
Sodium: 139 mEq/L (ref 135–145)

## 2019-09-11 LAB — CBC WITH DIFFERENTIAL/PLATELET
Basophils Absolute: 0.1 10*3/uL (ref 0.0–0.1)
Basophils Relative: 1.1 % (ref 0.0–3.0)
Eosinophils Absolute: 0.1 10*3/uL (ref 0.0–0.7)
Eosinophils Relative: 1.5 % (ref 0.0–5.0)
HCT: 45.4 % (ref 36.0–46.0)
Hemoglobin: 15.4 g/dL — ABNORMAL HIGH (ref 12.0–15.0)
Lymphocytes Relative: 25.7 % (ref 12.0–46.0)
Lymphs Abs: 2.2 10*3/uL (ref 0.7–4.0)
MCHC: 33.9 g/dL (ref 30.0–36.0)
MCV: 89.8 fl (ref 78.0–100.0)
Monocytes Absolute: 0.7 10*3/uL (ref 0.1–1.0)
Monocytes Relative: 8 % (ref 3.0–12.0)
Neutro Abs: 5.4 10*3/uL (ref 1.4–7.7)
Neutrophils Relative %: 63.7 % (ref 43.0–77.0)
Platelets: 342 10*3/uL (ref 150.0–400.0)
RBC: 5.06 Mil/uL (ref 3.87–5.11)
RDW: 13.4 % (ref 11.5–15.5)
WBC: 8.4 10*3/uL (ref 4.0–10.5)

## 2019-09-11 LAB — LIPID PANEL
Cholesterol: 240 mg/dL — ABNORMAL HIGH (ref 0–200)
HDL: 69.4 mg/dL (ref >39.00)
LDL Cholesterol: 146 mg/dL — ABNORMAL HIGH (ref 0–99)
NonHDL: 170.51
Total CHOL/HDL Ratio: 3
Triglycerides: 124 mg/dL (ref 0.0–149.0)
VLDL: 24.8 mg/dL (ref 0.0–40.0)

## 2019-09-11 LAB — T4, FREE: Free T4: 0.74 ng/dL (ref 0.60–1.60)

## 2019-09-11 LAB — HEMOGLOBIN A1C: Hgb A1c MFr Bld: 5.1 % (ref 4.6–6.5)

## 2019-09-11 LAB — T3: T3, Total: 142 ng/dL (ref 76–181)

## 2019-09-11 LAB — TSH: TSH: 7.74 u[IU]/mL — ABNORMAL HIGH (ref 0.35–4.50)

## 2019-09-12 ENCOUNTER — Ambulatory Visit (INDEPENDENT_AMBULATORY_CARE_PROVIDER_SITE_OTHER): Payer: Medicare PPO

## 2019-09-12 ENCOUNTER — Other Ambulatory Visit: Payer: Self-pay

## 2019-09-12 VITALS — BP 128/85 | HR 72 | Wt 173.0 lb

## 2019-09-12 DIAGNOSIS — Z0000 Encounter for general adult medical examination without abnormal findings: Secondary | ICD-10-CM | POA: Diagnosis not present

## 2019-09-12 NOTE — Patient Instructions (Signed)
Ms. Betty Sanchez , Thank you for taking time to come for your Medicare Wellness Visit. I appreciate your ongoing commitment to your health goals. Please review the following plan we discussed and let me know if I can assist you in the future.   Screening recommendations/referrals: Colonoscopy: declined Mammogram: declined Bone Density: declined Recommended yearly ophthalmology/optometry visit for glaucoma screening and checkup Recommended yearly dental visit for hygiene and checkup  Vaccinations: Influenza vaccine: declined Pneumococcal vaccine: declined Tdap vaccine: decline Shingles vaccine: declined    Advanced directives: Please bring a copy of your POA (Power of Attorney) and/or Living Will to your next appointment.   Conditions/risks identified: hypertension  Next appointment: 09/13/2019 @ 8:30 am    Preventive Care 65 Years and Older, Female Preventive care refers to lifestyle choices and visits with your health care provider that can promote health and wellness. What does preventive care include?  A yearly physical exam. This is also called an annual well check.  Dental exams once or twice a year.  Routine eye exams. Ask your health care provider how often you should have your eyes checked.  Personal lifestyle choices, including:  Daily care of your teeth and gums.  Regular physical activity.  Eating a healthy diet.  Avoiding tobacco and drug use.  Limiting alcohol use.  Practicing safe sex.  Taking low-dose aspirin every day.  Taking vitamin and mineral supplements as recommended by your health care provider. What happens during an annual well check? The services and screenings done by your health care provider during your annual well check will depend on your age, overall health, lifestyle risk factors, and family history of disease. Counseling  Your health care provider may ask you questions about your:  Alcohol use.  Tobacco use.  Drug use.  Emotional  well-being.  Home and relationship well-being.  Sexual activity.  Eating habits.  History of falls.  Memory and ability to understand (cognition).  Work and work Statistician.  Reproductive health. Screening  You may have the following tests or measurements:  Height, weight, and BMI.  Blood pressure.  Lipid and cholesterol levels. These may be checked every 5 years, or more frequently if you are over 21 years old.  Skin check.  Lung cancer screening. You may have this screening every year starting at age 75 if you have a 30-pack-year history of smoking and currently smoke or have quit within the past 15 years.  Fecal occult blood test (FOBT) of the stool. You may have this test every year starting at age 68.  Flexible sigmoidoscopy or colonoscopy. You may have a sigmoidoscopy every 5 years or a colonoscopy every 10 years starting at age 69.  Hepatitis C blood test.  Hepatitis B blood test.  Sexually transmitted disease (STD) testing.  Diabetes screening. This is done by checking your blood sugar (glucose) after you have not eaten for a while (fasting). You may have this done every 1-3 years.  Bone density scan. This is done to screen for osteoporosis. You may have this done starting at age 24.  Mammogram. This may be done every 1-2 years. Talk to your health care provider about how often you should have regular mammograms. Talk with your health care provider about your test results, treatment options, and if necessary, the need for more tests. Vaccines  Your health care provider may recommend certain vaccines, such as:  Influenza vaccine. This is recommended every year.  Tetanus, diphtheria, and acellular pertussis (Tdap, Td) vaccine. You may need a Td  booster every 10 years.  Zoster vaccine. You may need this after age 53.  Pneumococcal 13-valent conjugate (PCV13) vaccine. One dose is recommended after age 27.  Pneumococcal polysaccharide (PPSV23) vaccine. One  dose is recommended after age 21. Talk to your health care provider about which screenings and vaccines you need and how often you need them. This information is not intended to replace advice given to you by your health care provider. Make sure you discuss any questions you have with your health care provider. Document Released: 07/19/2015 Document Revised: 03/11/2016 Document Reviewed: 04/23/2015 Elsevier Interactive Patient Education  2017 [place] Prevention in the Home Falls can cause injuries. They can happen to people of all ages. There are many things you can do to make your home safe and to help prevent falls. What can I do on the outside of my home?  Regularly fix the edges of walkways and driveways and fix any cracks.  Remove anything that might make you trip as you walk through a door, such as a raised step or threshold.  Trim any bushes or trees on the path to your home.  Use bright outdoor lighting.  Clear any walking paths of anything that might make someone trip, such as rocks or tools.  Regularly check to see if handrails are loose or broken. Make sure that both sides of any steps have handrails.  Any raised decks and porches should have guardrails on the edges.  Have any leaves, snow, or ice cleared regularly.  Use sand or salt on walking paths during winter.  Clean up any spills in your garage right away. This includes oil or grease spills. What can I do in the bathroom?  Use night lights.  Install grab bars by the toilet and in the tub and shower. Do not use towel bars as grab bars.  Use non-skid mats or decals in the tub or shower.  If you need to sit down in the shower, use a plastic, non-slip stool.  Keep the floor dry. Clean up any water that spills on the floor as soon as it happens.  Remove soap buildup in the tub or shower regularly.  Attach bath mats securely with double-sided non-slip rug tape.  Do not have throw rugs and other  things on the floor that can make you trip. What can I do in the bedroom?  Use night lights.  Make sure that you have a light by your bed that is easy to reach.  Do not use any sheets or blankets that are too big for your bed. They should not hang down onto the floor.  Have a firm chair that has side arms. You can use this for support while you get dressed.  Do not have throw rugs and other things on the floor that can make you trip. What can I do in the kitchen?  Clean up any spills right away.  Avoid walking on wet floors.  Keep items that you use a lot in easy-to-reach places.  If you need to reach something above you, use a strong step stool that has a grab bar.  Keep electrical cords out of the way.  Do not use floor polish or wax that makes floors slippery. If you must use wax, use non-skid floor wax.  Do not have throw rugs and other things on the floor that can make you trip. What can I do with my stairs?  Do not leave any items on the stairs.  Make sure that there are handrails on both sides of the stairs and use them. Fix handrails that are broken or loose. Make sure that handrails are as long as the stairways.  Check any carpeting to make sure that it is firmly attached to the stairs. Fix any carpet that is loose or worn.  Avoid having throw rugs at the top or bottom of the stairs. If you do have throw rugs, attach them to the floor with carpet tape.  Make sure that you have a light switch at the top of the stairs and the bottom of the stairs. If you do not have them, ask someone to add them for you. What else can I do to help prevent falls?  Wear shoes that:  Do not have high heels.  Have rubber bottoms.  Are comfortable and fit you well.  Are closed at the toe. Do not wear sandals.  If you use a stepladder:  Make sure that it is fully opened. Do not climb a closed stepladder.  Make sure that both sides of the stepladder are locked into place.  Ask  someone to hold it for you, if possible.  Clearly mark and make sure that you can see:  Any grab bars or handrails.  First and last steps.  Where the edge of each step is.  Use tools that help you move around (mobility aids) if they are needed. These include:  Canes.  Walkers.  Scooters.  Crutches.  Turn on the lights when you go into a dark area. Replace any light bulbs as soon as they burn out.  Set up your furniture so you have a clear path. Avoid moving your furniture around.  If any of your floors are uneven, fix them.  If there are any pets around you, be aware of where they are.  Review your medicines with your doctor. Some medicines can make you feel dizzy. This can increase your chance of falling. Ask your doctor what other things that you can do to help prevent falls. This information is not intended to replace advice given to you by your health care provider. Make sure you discuss any questions you have with your health care provider. Document Released: 04/18/2009 Document Revised: 11/28/2015 Document Reviewed: 07/27/2014 Elsevier Interactive Patient Education  2017 [employer].

## 2019-09-12 NOTE — Progress Notes (Signed)
Subjective:   Betty Sanchez is a 74 y.o. female who presents for Medicare Annual (Subsequent) preventive examination.  Review of Systems: N/A   This visit is being conducted through telemedicine via telephone at the nurse health advisor's home address due to the COVID-19 pandemic. This patient has given me verbal consent via doximity to conduct this visit, patient states they are participating from their home address. Patient and myself are on the telephone call. There is no referral for this visit. Some vital signs may be absent or patient reported.    Patient identification: identified by name, DOB, and current address   Cardiac Risk Factors include: advanced age (>75men, >69 women);hypertension     Objective:     Vitals: BP 128/85   Pulse 72   Wt 173 lb (78.5 kg)   BMI 29.24 kg/m   Body mass index is 29.24 kg/m.  Advanced Directives 09/12/2019 09/02/2018 01/03/2016  Does Patient Have a Medical Advance Directive? Yes Yes Yes  Type of Paramedic of [place];Living will [place];Living will [place];Living will;Out of facility DNR (pink MOST or yellow form)  Copy of [place] in Chart? No - copy requested No - copy requested Yes  Would patient like information on creating a medical advance directive? - - No - patient declined information    Tobacco Social History   Tobacco Use  Smoking Status Never Smoker  Smokeless Tobacco Never Used     Counseling given: Not Answered   Clinical Intake:  Pre-visit preparation completed: Yes  Pain : No/denies pain     Nutritional Risks: None Diabetes: No  How often do you need to have someone help you when you read instructions, pamphlets, or other written materials from your doctor or pharmacy?: 1 - Never What is the last grade level you completed in school?: Doctorate  Interpreter Needed?: No  Information entered by :: CJohnson, LPN  Past  Medical History:  Diagnosis Date  . Cataract 2019   bilateral eyes; corrected with surgery  . Fall 12/2015   multiple falls in week    Past Surgical History:  Procedure Laterality Date  . CATARACT EXTRACTION W/ INTRAOCULAR LENS  IMPLANT, BILATERAL  2019  . TONSILLECTOMY     History reviewed. No pertinent family history. Social History   Socioeconomic History  . Marital status: Single    Spouse name: Not on file  . Number of children: Not on file  . Years of education: Not on file  . Highest education level: Not on file  Occupational History  . Not on file  Tobacco Use  . Smoking status: Never Smoker  . Smokeless tobacco: Never Used  Substance and Sexual Activity  . Alcohol use: No  . Drug use: Never  . Sexual activity: Not Currently  Other Topics Concern  . Not on file  Social History Narrative  . Not on file   Social Determinants of Health   Financial Resource Strain: Low Risk   . Difficulty of Paying Living Expenses: Not hard at all  Food Insecurity: No Food Insecurity  . Worried About Charity fundraiser in the Last Year: Never true  . Ran Out of Food in the Last Year: Never true  Transportation Needs: No Transportation Needs  . Lack of Transportation (Medical): No  . Lack of Transportation (Non-Medical): No  Physical Activity: Sufficiently Active  . Days of Exercise per Week: 7 days  . Minutes of Exercise per  Session: 60 min  Stress: No Stress Concern Present  . Feeling of Stress : Not at all  Social Connections:   . Frequency of Communication with Friends and Family: Not on file  . Frequency of Social Gatherings with Friends and Family: Not on file  . Attends Religious Services: Not on file  . Active Member of Clubs or Organizations: Not on file  . Attends Archivist Meetings: Not on file  . Marital Status: Not on file    Outpatient Encounter Medications as of 09/12/2019  Medication Sig  . amLODipine (NORVASC) 10 MG tablet TAKE 1 TABLET(10 MG)  BY MOUTH DAILY  . chlorhexidine (PERIDEX) 0.12 % solution Use as directed 15 mLs in the mouth or throat 2 (two) times daily.  . clonazePAM (KLONOPIN) 1 MG tablet Take 0.5-1 tablets (0.5-1 mg total) by mouth 2 (two) times daily as needed for anxiety. (Patient not taking: Reported on 09/12/2019)   No facility-administered encounter medications on file as of 09/12/2019.    Activities of Daily Living In your present state of health, do you have any difficulty performing the following activities: 09/12/2019  Hearing? N  Vision? N  Difficulty concentrating or making decisions? N  Walking or climbing stairs? N  Dressing or bathing? N  Doing errands, shopping? N  Preparing Food and eating ? N  Using the Toilet? N  In the past six months, have you accidently leaked urine? N  Do you have problems with loss of bowel control? N  Managing your Medications? N  Managing your Finances? N  Housekeeping or managing your Housekeeping? N  Some recent data might be hidden    Patient Care Team: Elby Beck, FNP as PCP - General (Nurse Practitioner)    Assessment:   This is a routine wellness examination for Betty Sanchez.  Exercise Activities and Dietary recommendations Current Exercise Habits: Home exercise routine, Type of exercise: walking, Time (Minutes): 60, Frequency (Times/Week): 7, Weekly Exercise (Minutes/Week): 420, Intensity: Moderate, Exercise limited by: None identified  Goals    . Patient Stated     Starting 09/02/2018, I will continue to exercise for 75 minutes twice weekly.     . Patient Stated     09/12/2019, I will continue to walk 2 miles everyday.        Fall Risk Fall Risk  09/12/2019 09/02/2018 12/27/2017  Falls in the past year? 0 0 No  Number falls in past yr: 0 - -  Injury with Fall? 0 - -  Risk for fall due to : Medication side effect - -  Follow up Falls evaluation completed;Falls prevention discussed - -   Is the patient's home free of loose throw rugs in walkways, pet  beds, electrical cords, etc?   yes      Grab bars in the bathroom? yes      Handrails on the stairs?   yes      Adequate lighting?   yes  Timed Get Up and Go performed: N/A  Depression Screen PHQ 2/9 Scores 09/12/2019 09/02/2018 12/27/2017  PHQ - 2 Score 0 0 0  PHQ- 9 Score 0 0 -     Cognitive Function MMSE - Mini Mental State Exam 09/12/2019 09/02/2018  Orientation to time 5 5  Orientation to Place 5 5  Registration 3 3  Attention/ Calculation 5 0  Recall 3 3  Language- name 2 objects - 0  Language- repeat 1 1  Language- follow 3 step command - 3  Language- read &  follow direction - 0  Write a sentence - 0  Copy design - 0  Total score - 20  Mini Cog  Mini-Cog screen was completed. Maximum score is 22. A value of 0 denotes this part of the MMSE was not completed or the patient failed this part of the Mini-Cog screening.       Immunization History  Administered Date(s) Administered  . PFIZER SARS-COV-2 Vaccination 08/11/2019, 09/05/2019    Qualifies for Shingles Vaccine: Yes  Screening Tests Health Maintenance  Topic Date Due  . INFLUENZA VACCINE  10/04/2019 (Originally 02/04/2019)  . DEXA SCAN  07/05/2020 (Originally 06/29/2012)  . TETANUS/TDAP  07/05/2020 (Originally 06/29/1966)  . MAMMOGRAM  09/11/2020 (Originally 06/29/1997)  . COLONOSCOPY  09/11/2020 (Originally 06/29/1997)  . PNA vac Low Risk Adult (1 of 2 - PCV13) 07/05/2049 (Originally 06/29/2012)  . Hepatitis C Screening  Completed    Cancer Screenings: Lung: Low Dose CT Chest recommended if Age 72-80 years, 30 pack-year currently smoking OR have quit w/in 15 years. Patient does not qualify. Breast:  Up to date on Mammogram: No, Patient declined   Up to date of Bone Density/Dexa: No, Patient declined Colorectal: declined   Additional Screenings:  Hepatitis C Screening: 09/02/2018     Plan:   Patient will continue to walk everyday for 2 miles.    I have personally reviewed and noted the following in the  patient's chart:   . Medical and social history . Use of alcohol, tobacco or illicit drugs  . Current medications and supplements . Functional ability and status . Nutritional status . Physical activity . Advanced directives . List of other physicians . Hospitalizations, surgeries, and ER visits in previous 12 months . Vitals . Screenings to include cognitive, depression, and falls . Referrals and appointments  In addition, I have reviewed and discussed with patient certain preventive protocols, quality metrics, and best practice recommendations. A written personalized care plan for preventive services as well as general preventive health recommendations were provided to patient.     Andrez Grime, LPN  [id number]

## 2019-09-12 NOTE — Progress Notes (Signed)
PCP notes:  Health Maintenance: Colonoscopy- declined Mammogram- declined Dexa- declined Pneumonia vaccines- declined Flu vaccine- declined   Abnormal Screenings: none   Patient concerns: none   Nurse concerns: none   Next PCP appt.: 09/13/2019 @ 8:30 am

## 2019-09-13 ENCOUNTER — Encounter: Payer: Self-pay | Admitting: Family Medicine

## 2019-09-13 ENCOUNTER — Ambulatory Visit: Payer: Medicare Other

## 2019-09-13 ENCOUNTER — Other Ambulatory Visit: Payer: Self-pay

## 2019-09-13 ENCOUNTER — Ambulatory Visit (INDEPENDENT_AMBULATORY_CARE_PROVIDER_SITE_OTHER): Payer: Medicare PPO | Admitting: Family Medicine

## 2019-09-13 VITALS — BP 122/80 | HR 109 | Temp 98.2°F | Ht 65.0 in | Wt 175.8 lb

## 2019-09-13 DIAGNOSIS — I10 Essential (primary) hypertension: Secondary | ICD-10-CM

## 2019-09-13 DIAGNOSIS — E039 Hypothyroidism, unspecified: Secondary | ICD-10-CM | POA: Diagnosis not present

## 2019-09-13 DIAGNOSIS — Z789 Other specified health status: Secondary | ICD-10-CM | POA: Diagnosis not present

## 2019-09-13 DIAGNOSIS — E7800 Pure hypercholesterolemia, unspecified: Secondary | ICD-10-CM | POA: Diagnosis not present

## 2019-09-13 NOTE — Patient Instructions (Addendum)
Good to see you today  Follow up fasting labs 6 months  A resource that I like is www.dietdoctor.com/diabetes/diet  Here are some guidelines to help you with meal planning -  Avoid all processed and packaged foods (bread, pasta, crackers, chips, etc) and beverages containing calories.  Avoid added sugars and excessive natural sugars.  Attention to how you feel if you consume artificial sweeteners.  Do they make you more hungry or raise your blood sugar?  With every meal and snack, aim to get 20 g of protein (3 ounces of meat, 4 ounces of fish, 3 eggs, protein powder, 1 cup [place] yogurt, 1 cup cottage cheese, etc.)  Increase fiber in the form of non-starchy vegetables.  These help you feel full with very little carbohydrates and are good for gut health.  Eat 1 serving healthy carb per meal- 1/2 cup brown rice, beans, potato, corn- pay attention to whether or not this significantly raises your blood sugar. If it does, reduce the frequency you consume these.   Eat 2-3 servings of lower sugar fruits daily.  This includes berries, apples, oranges, peaches, pears, one half banana.  Have small amounts of good fats such as avocado, nuts, olive oil, nut butters, olives.  Add a little cheese to your salads to make them tasty.

## 2019-09-13 NOTE — Progress Notes (Signed)
Subjective:    Patient ID: Betty Sanchez, female    DOB: Oct 14, 1945, 74 y.o.   MRN: [id number]  HPI This is a 74 yo female who presents today for her annual exam. She prefers not to have a physical exam today, just follow up on chronic medical condition.   Hypertension-she checks her blood pressure periodically at home and brings in her readings today.  Most readings in the 110s to 120s over 60s and 70s.  One reading with systolic of [id number].  Tolerating medication without side effects.  She has been checking her heart rate and oxygen level on a pulse oximeter and reports that her heart rate is in the 60s and 70s.  She denies chest pain, shortness of breath, leg swelling.  Elevated cholesterol-has been elevated in the past and continues to have elevated LDL today with ASCVD risk 14%.  Hypothyroidism-TSH has been mildly elevated.  Patient is taking biotin.  She denies any fatigue, hair loss, constipation.  Overdue health maintenance-patient overdue for mammogram, colonoscopy, Pap, bone density study.  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  This has been delayed by Covid and she has been unable to get the work done that she wants to finish up prior to addressing these issues.     Review of Systems Per HPI    Objective:   Physical Exam Vitals reviewed.  Constitutional:      General: She is not in acute distress.    Appearance: Normal appearance. She is normal weight. She is not ill-appearing, toxic-appearing or diaphoretic.  HENT:     Head: Normocephalic and atraumatic.  Eyes:     Conjunctiva/sclera: Conjunctivae normal.  Cardiovascular:     Rate and Rhythm: Normal rate and regular rhythm.     Heart sounds: Normal heart sounds.     Comments: He.  Art rate 80 on auscultation Pulmonary:     Effort: Pulmonary effort is normal.     Breath sounds: Normal breath sounds.  Musculoskeletal:     Cervical  back: Normal range of motion and neck supple.     Right lower leg: No edema.     Left lower leg: No edema.  Skin:    General: Skin is warm and dry.  Neurological:     Mental Status: She is alert and oriented to person, place, and time.  Psychiatric:        Mood and Affect: Mood normal.        Behavior: Behavior normal.        Thought Content: Thought content normal.        Judgment: Judgment normal.       BP 122/80 (BP Location: Left Arm, Patient Position: Sitting, Cuff Size: Normal)   Pulse (!) 109   Temp 98.2 F (36.8 C) (Temporal)   Ht 5\' 5"  (1.651 m)   Wt 175 lb 12.8 oz (79.7 kg)   SpO2 97%   BMI 29.25 kg/m  Wt Readings from Last 3 Encounters:  09/13/19 175 lb 12.8 oz (79.7 kg)  09/12/19 173 lb (78.5 kg)  09/07/18 176 lb 12 oz (80.2 kg)       Assessment & Plan:  1. Acquired hypothyroidism -Have advised her to stop biotin and will recheck labs in 6 months.  She is very reluctant to go on any medication. - Lipid panel; Future - TSH; Future - T4, Free; Future - T3; Future  2.  Elevated LDL cholesterol level -She will work on diet and modest weight loss.  Discussed her ASCVD risk and starting a statin but she is not interested at this time. - Lipid panel; Future  3. Health maintenance alteration -She declines mammogram, bone density scan, colon cancer screening at this time.  We will discuss at follow-up.  4. Essential hypertension -Well-controlled on current medication  This visit occurred during the SARS-CoV-2 public health emergency.  Safety protocols were in place, including screening questions prior to the visit, additional usage of staff PPE, and extensive cleaning of exam room while observing appropriate contact time as indicated for disinfecting solutions.      Clarene Reamer, FNP-BC  [facility]  09/13/2019 9:09 AM

## 2019-10-21 ENCOUNTER — Emergency Department (HOSPITAL_COMMUNITY)
Admission: EM | Admit: 2019-10-21 | Discharge: 2019-10-21 | Disposition: A | Discharge status: Home / Self Care | Payer: Medicare PPO | Attending: Emergency Medicine | Admitting: Emergency Medicine

## 2019-10-21 ENCOUNTER — Encounter (HOSPITAL_COMMUNITY): Payer: Self-pay | Admitting: Emergency Medicine

## 2019-10-21 ENCOUNTER — Emergency Department (HOSPITAL_COMMUNITY): Payer: Medicare PPO

## 2019-10-21 ENCOUNTER — Other Ambulatory Visit: Payer: Self-pay

## 2019-10-21 DIAGNOSIS — Z79899 Other long term (current) drug therapy: Secondary | ICD-10-CM | POA: Insufficient documentation

## 2019-10-21 DIAGNOSIS — N9489 Other specified conditions associated with female genital organs and menstrual cycle: Secondary | ICD-10-CM | POA: Diagnosis not present

## 2019-10-21 DIAGNOSIS — N939 Abnormal uterine and vaginal bleeding, unspecified: Secondary | ICD-10-CM

## 2019-10-21 DIAGNOSIS — N950 Postmenopausal bleeding: Secondary | ICD-10-CM

## 2019-10-21 LAB — CBC
HCT: 47.4 % — ABNORMAL HIGH (ref 36.0–46.0)
HCT: 47.6 % — ABNORMAL HIGH (ref 36.0–46.0)
Hemoglobin: 15.9 g/dL — ABNORMAL HIGH (ref 12.0–15.0)
Hemoglobin: 15.9 g/dL — ABNORMAL HIGH (ref 12.0–15.0)
MCH: 30.6 pg (ref 26.0–34.0)
MCH: 30.1 pg (ref 26.0–34.0)
MCHC: 33.5 g/dL (ref 30.0–36.0)
MCHC: 33.4 g/dL (ref 30.0–36.0)
MCV: 91.3 fL (ref 80.0–100.0)
MCV: 90.2 fL (ref 80.0–100.0)
Platelets: 347 10*3/uL (ref 150–400)
Platelets: 321 10*3/uL (ref 150–400)
RBC: 5.19 MIL/uL — ABNORMAL HIGH (ref 3.87–5.11)
RBC: 5.28 MIL/uL — ABNORMAL HIGH (ref 3.87–5.11)
RDW: 13.1 % (ref 11.5–15.5)
RDW: 13.1 % (ref 11.5–15.5)
WBC: 9.2 10*3/uL (ref 4.0–10.5)
WBC: 11.3 10*3/uL — ABNORMAL HIGH (ref 4.0–10.5)
nRBC: 0 % (ref 0.0–0.2)
nRBC: 0 % (ref 0.0–0.2)

## 2019-10-21 LAB — COMPREHENSIVE METABOLIC PANEL
ALT: 15 U/L (ref 0–44)
AST: 21 U/L (ref 15–41)
Albumin: 4.2 g/dL (ref 3.5–5.0)
Alkaline Phosphatase: 67 U/L (ref 38–126)
Anion gap: 18 — ABNORMAL HIGH (ref 5–15)
BUN: 12 mg/dL (ref 8–23)
CO2: 17 mmol/L — ABNORMAL LOW (ref 22–32)
Calcium: 9.6 mg/dL (ref 8.9–10.3)
Chloride: 103 mmol/L (ref 98–111)
Creatinine, Ser: 0.78 mg/dL (ref 0.44–1.00)
GFR calc Af Amer: >60 mL/min (ref >60)
GFR calc non Af Amer: >60 mL/min (ref >60)
Glucose, Bld: 172 mg/dL — ABNORMAL HIGH (ref 70–99)
Potassium: 3.4 mmol/L — ABNORMAL LOW (ref 3.5–5.1)
Sodium: 138 mmol/L (ref 135–145)
Total Bilirubin: 0.9 mg/dL (ref 0.3–1.2)
Total Protein: 7.6 g/dL (ref 6.5–8.1)

## 2019-10-21 LAB — ABO/RH: ABO/RH(D): A POS

## 2019-10-21 LAB — TYPE AND SCREEN
ABO/RH(D): A POS
Antibody Screen: NEGATIVE

## 2019-10-21 LAB — T4, FREE: Free T4: 0.77 ng/dL (ref 0.61–1.12)

## 2019-10-21 LAB — TSH: TSH: 11.17 u[IU]/mL — ABNORMAL HIGH (ref 0.350–4.500)

## 2019-10-21 MED ORDER — LORAZEPAM 1 MG PO TABS
1.0000 mg | ORAL_TABLET | Freq: Once | ORAL | Status: AC
  Administered 2019-10-21: 1 mg via ORAL
  Filled 2019-10-21: qty 1

## 2019-10-21 MED ORDER — MEGESTROL ACETATE 40 MG PO TABS
40.0000 mg | ORAL_TABLET | Freq: Every day | ORAL | Status: DC
  Administered 2019-10-21: 21:00:00 40 mg via ORAL
  Filled 2019-10-21: qty 1

## 2019-10-21 MED ORDER — MEGESTROL ACETATE 40 MG PO TABS: 40.0000 mg | ORAL_TABLET | Freq: Two times a day (BID) | ORAL | 0 refills | Status: AC

## 2019-10-21 NOTE — Discharge Instructions (Signed)
You were diagnosed with a mass on your uterus is seen on an ultrasound today.  I suspect this is what is causing her bleeding.  You will need close follow-up with an OB/GYN doctor for an endometrial biopsy.  This will help determine whether this mass may be a cancer.  In the meantime, to help with your bleeding, our gynecological doctor recommended that you start taking a hormone called Megace.  This is an estrogen hormone.  It can decrease the amount of bleeding that you are having.  You can take this twice a day.  The [facility] office should reach out to you in the next 2 days.  If you do not hear from them, I included the office number for Dr. Roselie Awkward, who was the [facility] physician on call tonight.  You can call them on Monday to ask about a follow up appointment.  If you feel like your bleeding is getting much worse, or begin feeling short of breath or lightheaded, you should return to the emergency department.  These may be signs of significant blood loss, which is a rare complication of your condition.

## 2019-10-21 NOTE — ED Triage Notes (Signed)
Pt reports bleeding starting an hour ago, pt unsure if vaginal or rectal. Reports bleeding through a towel today. Denies any pain.

## 2019-10-21 NOTE — ED Notes (Signed)
Patient verbalizes understanding of discharge instructions. Opportunity for questioning and answers were provided. Pt discharged from ED. 

## 2019-10-21 NOTE — ED Notes (Signed)
Pt transported to US

## 2019-10-21 NOTE — ED Provider Notes (Signed)
[facility] EMERGENCY DEPARTMENT Provider Note   CSN: [id number] Arrival date & time: 10/21/19  1445     History Chief Complaint  Patient presents with  . Vaginal Bleeding    Betty Sanchez is a 74 y.o. female history of essential hypertension presented to emergency department with vaginal bleeding.  Patient reports that she was in her usual state of health today she was down about when she noticed that she was having sudden painless bleeding into her pants.  She says it was "hemorrhage".  She has never happened her before.  She believes that she is bleeding from her vagina.  She reports has not had any bloody bowel movements or any issues of rectal bleeding or black or tarry stools in the past.  She says she has had a normal colonoscopy in the past.  She reports that she is postmenopausal for "many years now."  She denies any personal family history of endometrial uterine cancer.  She denies any sudden or unexpected weight loss.  She denies any night sweats or chills.  She denies any lower pelvic pain.  She does report that her PCP has initiated treatment for hypothyroidism recently, with her TSH elevated at 7.74 on 09/11/19 and her T4 normal (0.74) and T3 normal (142).  Patient NOT started on new medications at this time.  HPI     Past Medical History:  Diagnosis Date  . Cataract 2019   bilateral eyes; corrected with surgery  . Fall 12/2015   multiple falls in week     Patient Active Problem List   Diagnosis Date Noted  . Essential hypertension 09/08/2018  . 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 CATARACT EXTRACTION W/ INTRAOCULAR LENS  IMPLANT, BILATERAL  2019  . TONSILLECTOMY       OB History   No obstetric history on file.     No family history on file.  Social History   Tobacco Use  . Smoking status: Never Smoker  . Smokeless tobacco: Never Used    Substance Use Topics  . Alcohol use: No  . Drug use: Never    Home Medications Prior to Admission medications   Medication Sig Start Date End Date Taking? Authorizing Provider  amLODipine (NORVASC) 10 MG tablet TAKE 1 TABLET(10 MG) BY MOUTH DAILY 12/19/18   Elby Beck, FNP  megestrol (MEGACE) 40 MG tablet Take 1 tablet (40 mg total) by mouth 2 (two) times daily for 14 days. 10/21/19 11/04/19  Wyvonnia Dusky, MD    Allergies    Patient has no known allergies.  Review of Systems   Review of Systems  Constitutional: Negative for chills and fever.  Eyes: Negative for pain and visual disturbance.  Respiratory: Negative for cough and shortness of breath.   Cardiovascular: Negative for chest pain and palpitations.  Gastrointestinal: Negative for abdominal pain, blood in stool, constipation, diarrhea, nausea and vomiting.  Genitourinary: Positive for vaginal bleeding. Negative for difficulty urinating, dysuria, flank pain, hematuria, menstrual problem, vaginal discharge and vaginal pain.  Musculoskeletal: Negative for arthralgias and back pain.  Skin: Negative for rash and wound.  Neurological: Negative for syncope, light-headedness and headaches.  Psychiatric/Behavioral: Negative for agitation and confusion.  All other systems reviewed and are negative.   Physical Exam Updated Vital Signs BP (!) 164/89   Pulse (!) 132   Temp 97.6 F (36.4 C) (Oral)   Resp 15  Ht 5\' 6"  (1.676 m)   Wt 74.8 kg   SpO2 97%   BMI 26.63 kg/m   Physical Exam Vitals and nursing note reviewed.  Constitutional:      General: She is not in acute distress.    Appearance: She is well-developed.  HENT:     Head: Normocephalic and atraumatic.  Eyes:     Conjunctiva/sclera: Conjunctivae normal.  Cardiovascular:     Rate and Rhythm: Regular rhythm. Tachycardia present.     Pulses: Normal pulses.  Pulmonary:     Effort: Pulmonary effort is normal. No respiratory distress.     Breath sounds:  Normal breath sounds.  Abdominal:     Palpations: Abdomen is soft.     Tenderness: There is no abdominal tenderness.  Genitourinary:    Comments: Exam performed with chaperone present. External: Normal external female genitalia. No lesions, rashes, drainage, or suspicious lymph nodes. Internal: Moderate blood pooling in vaginal vault without clots, bleeding from cervical os. No lacerations. No foreign bodies.  No purulent discharge or malodor.   Musculoskeletal:     Cervical back: Neck supple.  Skin:    General: Skin is warm and dry.  Neurological:     Mental Status: She is alert.  Psychiatric:        Mood and Affect: Mood normal.        Behavior: Behavior normal.     ED Results / Procedures / Treatments   Labs (all labs ordered are listed, but only abnormal results are displayed) Labs Reviewed  COMPREHENSIVE METABOLIC PANEL - Abnormal; Notable for the following components:      Result Value   Potassium 3.4 (*)    CO2 17 (*)    Glucose, Bld 172 (*)    Anion gap 18 (*)    All other components within normal limits  CBC - Abnormal; Notable for the following components:   RBC 5.19 (*)    Hemoglobin 15.9 (*)    HCT 47.4 (*)    All other components within normal limits  TSH - Abnormal; Notable for the following components:   TSH 11.170 (*)    All other components within normal limits  CBC - Abnormal; Notable for the following components:   WBC 11.3 (*)    RBC 5.28 (*)    Hemoglobin 15.9 (*)    HCT 47.6 (*)    All other components within normal limits  T4, FREE  T3, FREE  TYPE AND SCREEN  ABO/RH    EKG None  Radiology US PELVIC COMPLETE WITH TRANSVAGINAL  Result Date: 10/21/2019 CLINICAL DATA:  Postmenopausal bleeding EXAM: TRANSABDOMINAL AND TRANSVAGINAL ULTRASOUND OF PELVIS TECHNIQUE: Both transabdominal and transvaginal ultrasound examinations of the pelvis were performed. Transabdominal technique was performed for global imaging of the pelvis including uterus,  ovaries, adnexal regions, and pelvic cul-de-sac. It was necessary to proceed with endovaginal exam following the transabdominal exam to visualize the endometrium. COMPARISON:  None FINDINGS: Uterus Measurements: 8.0 x 5.1 by 5.3 cm = volume: 113 mL. No fibroids or other mass visualized. Endometrium Thickness: 3.8 cm. There is masslike heterogeneous thickening of the endometrium measuring 4.2 x 5.6 x 3.8 cm in total. Endometrial neoplasm is not excluded, and endometrial sampling is recommended. Right ovary Not visualized Left ovary Not visualized Other findings No abnormal free fluid. IMPRESSION: 1. Heterogeneous masslike thickening of the endometrium, highly concerning for endometrial neoplasm. Endometrial sampling is recommended. 2. Nonvisualization of the ovaries, which may be atrophic. Electronically Signed   By: Legrand Como  Owens Shark M.D.   On: 10/21/2019 19:32    Procedures Procedures (including critical care time)  Medications Ordered in ED Medications  megestrol (MEGACE) tablet 40 mg (40 mg Oral Given 10/21/19 2048)  LORazepam (ATIVAN) tablet 1 mg (1 mg Oral Given 10/21/19 1625)    ED Course  I have reviewed the triage vital signs and the nursing notes.  Pertinent labs & imaging results that were available during my care of the patient were reviewed by me and considered in my medical decision making (see chart for details).  74 yo female here with post-menopausal abnormal uterine bleeding.  Moderate pooling of blood in vagina on exam.  Patient otherwise well appearing aside from self-reported anxiety at "being in the hospital and around doctors."  Suspect this is the cause of her tachycardia.  Hgb is normal here, doubtful this is tachycardia from hypovolemic shock.  I offered some lorazepam for anxiety (she takes clonazepam at home), which she accepted.  Her ultrasound demonstrated heterogenous endometrial mass concerning for neoplasm.  I discussed with case with Dr Roselie Awkward, GYN, by phone as noted  below and came up with a follow up plan for her.  I rechecked her hgb which was stable after several hours observation.  I did not believe she was having significant blood loss in the ED.  On my reassessment she was comfortable, no lightheadness, no SOB, just reporting anxiety about her diagnosis.  I personally ordered and reviewed her lab tests and imaging.  Clinical Course as of Oct 21 133  Sat Oct 21, 2019  1620 Pelvic exam with moderate pooling of blood in the vaginal vault.  She appears to be bleeding from the cervix.  No evidence of infection on my pelvic exam.  I offered her dose of Ativan for anxiety she says being in the hospital makes her feel extremely anxious.  Also obtain a pelvic ultrasound to image for possible mass, as cancer remains on the differential   [MT]  2030 I spoke to Dr. Roselie Awkward from the on-call [facility].  We discussed her ultrasound findings.  He recommends starting her on Megace 40 mg twice a day and says it is office to reach out to her for follow-up for endometrial biopsy.  The story is concerning for an endometrial neoplasm.   [MT]  2030 Awaiting repeat CBC    [MT]    Clinical Course User Index [MT] Shi Grose, Carola Rhine, MD   Final Clinical Impression(s) / ED Diagnoses Final diagnoses:  Endometrial mass  Abnormal uterine bleeding    Rx / DC Orders ED Discharge Orders         Ordered    megestrol (MEGACE) 40 MG tablet  2 times daily     10/21/19 2051           Wyvonnia Dusky, MD 10/22/19 [phone]

## 2019-10-22 ENCOUNTER — Encounter: Payer: Self-pay | Admitting: Family Medicine

## 2019-10-22 LAB — T3, FREE: T3, Free: 3.5 pg/mL (ref 2.0–4.4)

## 2019-10-26 ENCOUNTER — Other Ambulatory Visit: Payer: Self-pay

## 2019-10-26 ENCOUNTER — Other Ambulatory Visit (HOSPITAL_COMMUNITY)
Admission: RE | Admit: 2019-10-26 | Discharge: 2019-10-26 | Disposition: A | Discharge status: Home / Self Care | Payer: Medicare PPO | Source: Ambulatory Visit | Attending: Obstetrics & Gynecology | Admitting: Obstetrics & Gynecology

## 2019-10-26 ENCOUNTER — Encounter: Payer: Self-pay | Admitting: Obstetrics & Gynecology

## 2019-10-26 ENCOUNTER — Ambulatory Visit (INDEPENDENT_AMBULATORY_CARE_PROVIDER_SITE_OTHER): Payer: Medicare PPO | Admitting: Obstetrics & Gynecology

## 2019-10-26 VITALS — BP 177/94 | HR 128 | Wt 168.0 lb

## 2019-10-26 DIAGNOSIS — N852 Hypertrophy of uterus: Secondary | ICD-10-CM

## 2019-10-26 DIAGNOSIS — I10 Essential (primary) hypertension: Secondary | ICD-10-CM

## 2019-10-26 DIAGNOSIS — N950 Postmenopausal bleeding: Secondary | ICD-10-CM | POA: Diagnosis present

## 2019-10-26 NOTE — Addendum Note (Signed)
Addended by: Louisa Second E on: 10/26/2019 01:19 PM   Modules accepted: Orders

## 2019-10-26 NOTE — Patient Instructions (Signed)
Postmenopausal Bleeding  Postmenopausal bleeding is any bleeding that a woman has after she has entered into menopause. Menopause is the end of a woman's fertile years. After menopause, a woman no longer ovulates and does not have menstrual periods. Postmenopausal bleeding may have various causes, including:  Menopausal hormone therapy (MHT).  Endometrial atrophy. After menopause, low estrogen hormone levels cause the membrane that lines the uterus (endometrium) to become thinner. You may have bleeding as the endometrium thins.  Endometrial hyperplasia. This condition is caused by excess estrogen hormones and low levels of progesterone hormones. The excess estrogen causes the endometrium to thicken, which can lead to bleeding. In some cases, this can lead to cancer of the uterus.  Endometrial cancer.  Non-cancerous growths (polyps) on the endometrium, the lining of the uterus, or the cervix.  Uterine fibroids. These are non-cancerous growths in or around the uterus muscle tissue that can cause heavy bleeding. Any type of postmenopausal bleeding, even if it appears to be a typical menstrual period, should be evaluated by your health care provider. Treatment will depend on the cause of the bleeding. Follow these instructions at home:  Pay attention to any changes in your symptoms.  Avoid using tampons and douches as told by your health care provider.  Change your pads regularly.  Get regular pelvic exams and Pap tests.  Take iron supplements as told by your health care provider.  Take over-the-counter and prescription medicines only as told by your health care provider.  Keep all follow-up visits as told by your health care provider. This is important. Contact a health care provider if:  Your bleeding lasts more than 1 week.  You have abdominal pain.  You have bleeding with or after sexual intercourse.  You have bleeding that happens more often than every 3 weeks. Get help  right away if:  You have a fever, chills, headache, dizziness, muscle aches, and bleeding.  You have severe pain with bleeding.  You are passing blood clots.  You have heavy bleeding, need more than 1 pad an hour, and have never experienced this before.  You feel faint. Summary  Postmenopausal bleeding is any bleeding that a woman has after she has entered into menopause.  Postmenopausal bleeding may have various causes. Treatment will depend on the cause of the bleeding.  Any type of postmenopausal bleeding, even if it appears to be a typical menstrual period, should be evaluated by your health care provider.  Be sure to pay attention to any changes in your symptoms and keep all follow-up visits as told by your health care provider. This information is not intended to replace advice given to you by your health care provider. Make sure you discuss any questions you have with your health care provider. Document Revised: 09/29/2017 Document Reviewed: 09/15/2016 Elsevier Patient Education  2020 Elsevier Inc.  

## 2019-10-26 NOTE — Progress Notes (Addendum)
Patient ID: Betty Sanchez, female   DOB: [birth year], 74 y.o.   MRN: [id number]  Chief Complaint  Patient presents with  . Abnormal Uterine Bleeding    HPI Betty Sanchez is a 74 y.o. female.  [id number] She presented to the ED with postmenopausal bleeding 4/17. Ultrasound showed abnormal endometrial mass. She is scheduled for endometrial biopsy today. Bleeding has stopped since her ED visit while taking Megace HPI  Past Medical History:  Diagnosis Date  . Cataract 2019   bilateral eyes; corrected with surgery  . Fall 12/2015   multiple falls in week     Past Surgical History:  Procedure Laterality Date  . CATARACT EXTRACTION W/ INTRAOCULAR LENS  IMPLANT, BILATERAL  2019  . TONSILLECTOMY      No family history on file.  Social History Social History   Tobacco Use  . Smoking status: Never Smoker  . Smokeless tobacco: Never Used  Substance Use Topics  . Alcohol use: No  . Drug use: Never    No Known Allergies  Current Outpatient Medications  Medication Sig Dispense Refill  . amLODipine (NORVASC) 10 MG tablet TAKE 1 TABLET(10 MG) BY MOUTH DAILY 90 tablet 3  . megestrol (MEGACE) 40 MG tablet Take 1 tablet (40 mg total) by mouth 2 (two) times daily for 14 days. 28 tablet 0   No current facility-administered medications for this visit.    Review of Systems Review of Systems  Constitutional: Negative.   Respiratory: Negative.   Cardiovascular: Negative.   Gastrointestinal: Negative.   Endocrine: Negative.   Genitourinary: Positive for menstrual problem and vaginal bleeding. Negative for pelvic pain and vaginal discharge.    Blood pressure (!) 177/94, pulse (!) 128, weight 168 lb (76.2 kg).  Physical Exam Physical Exam Constitutional:      Appearance: Normal appearance.  Cardiovascular:     Rate and Rhythm: Normal rate.  Pulmonary:     Effort: Pulmonary effort is normal.  Abdominal:     General: Abdomen is flat.  Genitourinary:    General: Normal vulva.   Vagina: Vaginal discharge (dark blood) present.     Comments: Uterus normal size, no mass or tenderness Neurological:     Mental Status: She is alert.   Patient given informed consent, signed copy in the chart, time out was performed. Appropriate time out taken. . The patient was placed in the lithotomy position and the cervix brought into view with sterile speculum.  Portio of cervix cleansed x 2 with betadine swabs.  A tenaculum was placed in the anterior lip of the cervix.  The uterus was sounded for depth of 9 cm. A pipelle was introduced to into the uterus, suction created,  and an endometrial sample  43 old blood was obtained. All equipment was removed and accounted for.  The patient tolerated the procedure well.     Data Reviewed CLINICAL DATA:  Postmenopausal bleeding  EXAM: TRANSABDOMINAL AND TRANSVAGINAL ULTRASOUND OF PELVIS  TECHNIQUE: Both transabdominal and transvaginal ultrasound examinations of the pelvis were performed. Transabdominal technique was performed for global imaging of the pelvis including uterus, ovaries, adnexal regions, and pelvic cul-de-sac. It was necessary to proceed with endovaginal exam following the transabdominal exam to visualize the endometrium.  COMPARISON:  None  FINDINGS: Uterus  Measurements: 8.0 x 5.1 by 5.3 cm = volume: 113 mL. No fibroids or other mass visualized.  Endometrium  Thickness: 3.8 cm. There is masslike heterogeneous thickening of the endometrium measuring 4.2 x 5.6 x 3.8 cm in total. Endometrial  neoplasm is not excluded, and endometrial sampling is recommended.  Right ovary  Not visualized  Left ovary  Not visualized  Other findings  No abnormal free fluid.  IMPRESSION: 1. Heterogeneous masslike thickening of the endometrium, highly concerning for endometrial neoplasm. Endometrial sampling is recommended. 2. Nonvisualization of the ovaries, which may be atrophic.   Electronically  Signed   By: Randa Ngo M.D.   On: 10/21/2019 19:32 I reviewed the images  Assessment Essential hypertension  Postmenopausal bleeding Suspicion for endometrial neoplasm, biopsy pending   Plan F/u on biopsy result. If diagnostic we will refer to [facility]. If no diagnosis then she will need hysteroscopy/d&c.    [name] 10/26/2019, 11:02 AM

## 2019-10-30 ENCOUNTER — Encounter: Payer: Self-pay | Admitting: *Deleted

## 2019-10-30 LAB — SURGICAL PATHOLOGY

## 2019-10-31 ENCOUNTER — Telehealth: Payer: Self-pay

## 2019-11-05 NOTE — Telephone Encounter (Signed)
Late Entry- 10/31/19 called pt in regards to her MyChart message sent about her being concerned about her results of her endo bx.  Pt stated that she does not know what they mean and was worried.  I explained to the pt that she does not need to worry because her endo bx results came back benign.  I also advised pt that according to Dr. Roselie Awkward note on 10/26/19 "If diagnostic we will refer to [facility]. If no diagnosis then she will need hysteroscopy/d&c."  Pt verbalized understanding.   Mel Almond, RN

## 2019-11-22 ENCOUNTER — Other Ambulatory Visit: Payer: Self-pay

## 2019-11-22 ENCOUNTER — Encounter: Payer: Self-pay | Admitting: Family Medicine

## 2019-11-22 ENCOUNTER — Encounter: Payer: Self-pay | Admitting: Obstetrics & Gynecology

## 2019-11-22 ENCOUNTER — Ambulatory Visit (INDEPENDENT_AMBULATORY_CARE_PROVIDER_SITE_OTHER): Payer: Medicare PPO | Admitting: Obstetrics & Gynecology

## 2019-11-22 VITALS — BP 166/95 | HR 130 | Wt 164.3 lb

## 2019-11-22 DIAGNOSIS — E039 Hypothyroidism, unspecified: Secondary | ICD-10-CM

## 2019-11-22 DIAGNOSIS — N950 Postmenopausal bleeding: Secondary | ICD-10-CM

## 2019-11-22 DIAGNOSIS — I10 Essential (primary) hypertension: Secondary | ICD-10-CM

## 2019-11-22 MED ORDER — LEVOTHYROXINE SODIUM 25 MCG PO TABS: 25.0000 ug | ORAL_TABLET | Freq: Every day | ORAL | 2 refills | Status: DC

## 2019-11-22 MED ORDER — HYDROCHLOROTHIAZIDE 12.5 MG PO TABS: 12.5000 mg | ORAL_TABLET | Freq: Every day | ORAL | 2 refills | Status: DC

## 2019-11-22 MED ORDER — MEGESTROL ACETATE 40 MG PO TABS: 40.0000 mg | ORAL_TABLET | Freq: Every day | ORAL | 2 refills | Status: DC

## 2019-11-22 NOTE — Patient Instructions (Signed)
Postmenopausal Bleeding  Postmenopausal bleeding is any bleeding that a woman has after she has entered into menopause. Menopause is the end of a woman's fertile years. After menopause, a woman no longer ovulates and does not have menstrual periods. Postmenopausal bleeding may have various causes, including:  Menopausal hormone therapy (MHT).  Endometrial atrophy. After menopause, low estrogen hormone levels cause the membrane that lines the uterus (endometrium) to become thinner. You may have bleeding as the endometrium thins.  Endometrial hyperplasia. This condition is caused by excess estrogen hormones and low levels of progesterone hormones. The excess estrogen causes the endometrium to thicken, which can lead to bleeding. In some cases, this can lead to cancer of the uterus.  Endometrial cancer.  Non-cancerous growths (polyps) on the endometrium, the lining of the uterus, or the cervix.  Uterine fibroids. These are non-cancerous growths in or around the uterus muscle tissue that can cause heavy bleeding. Any type of postmenopausal bleeding, even if it appears to be a typical menstrual period, should be evaluated by your health care provider. Treatment will depend on the cause of the bleeding. Follow these instructions at home:  Pay attention to any changes in your symptoms.  Avoid using tampons and douches as told by your health care provider.  Change your pads regularly.  Get regular pelvic exams and Pap tests.  Take iron supplements as told by your health care provider.  Take over-the-counter and prescription medicines only as told by your health care provider.  Keep all follow-up visits as told by your health care provider. This is important. Contact a health care provider if:  Your bleeding lasts more than 1 week.  You have abdominal pain.  You have bleeding with or after sexual intercourse.  You have bleeding that happens more often than every 3 weeks. Get help  right away if:  You have a fever, chills, headache, dizziness, muscle aches, and bleeding.  You have severe pain with bleeding.  You are passing blood clots.  You have heavy bleeding, need more than 1 pad an hour, and have never experienced this before.  You feel faint. Summary  Postmenopausal bleeding is any bleeding that a woman has after she has entered into menopause.  Postmenopausal bleeding may have various causes. Treatment will depend on the cause of the bleeding.  Any type of postmenopausal bleeding, even if it appears to be a typical menstrual period, should be evaluated by your health care provider.  Be sure to pay attention to any changes in your symptoms and keep all follow-up visits as told by your health care provider. This information is not intended to replace advice given to you by your health care provider. Make sure you discuss any questions you have with your health care provider. Document Revised: 09/29/2017 Document Reviewed: 09/15/2016 Elsevier Patient Education  2020 Elsevier Inc.  

## 2019-11-22 NOTE — Progress Notes (Signed)
Cc: postmenopausal bleeding, f/u  74 y.o. G0P0000 She had endometrial biopsy for dx of postmenopausal bleeding, which was not diagnostic. She will require hysteroscopy and D&C. Her BP is elevated and TSH is abnormal, so I will start Synthroid.Add HCTZ Patient desires surgical management.  The risks of surgery were discussed in detail with the patient including but not limited to: bleeding which may require transfusion or reoperation; infection which may require prolonged hospitalization or re-hospitalization and antibiotic therapy; injury to bowel, bladder, ureters and major vessels or other surrounding organs; formation of adhesions; need for additional procedures including laparotomy; thromboembolic phenomenon; incisional problems and other postoperative or anesthesia complications.  Patient was told that the likelihood that her condition and symptoms will be treated effectively with this surgical management was very high; the postoperative expectations were also discussed in detail. The patient also understands the alternative treatment options which were discussed in full. All questions were answered.  She was told that she will be contacted by our surgical scheduler regarding the time and date of her surgery; routine preoperative instructions will be given to her by the preoperative nursing team.   She is aware of need for preoperative COVID testing and subsequent quarantine from time of test to time of surgery; she will be given further preoperative instructions at that [place] screening visit.  Printed patient education handouts about the procedure were given to the patient to review at home.  25 min face to face and coordination of care. Bx results reviewed and CT images 2017 reviewed with the patient   Woodroe Mode, MD 11/22/2019

## 2019-11-23 NOTE — Telephone Encounter (Signed)
Called patient to discuss her concerns. Pt was very adamant that she only wanted to discuss with Betty Sanchez her concerns. I advised that [name] was not in the office today and that she will not be back until Friday morning. I advised that I could gather her concerns/questions and relay them to [place] and then she can reach back out once available - pt declined, requests to speak with only Betty Sanchez.

## 2019-11-24 ENCOUNTER — Other Ambulatory Visit: Payer: Self-pay | Admitting: Family Medicine

## 2019-11-24 DIAGNOSIS — F064 Anxiety disorder due to known physiological condition: Secondary | ICD-10-CM

## 2019-11-24 MED ORDER — ALPRAZOLAM 1 MG PO TABS: 0.5000 mg | ORAL_TABLET | Freq: Two times a day (BID) | ORAL | 0 refills | Status: DC | PRN

## 2019-11-24 NOTE — Telephone Encounter (Signed)
Called and spoke to patient regarding Dr. Jordan Hawks prescriptions and reassured her. Discussed upcoming procedure, tests that have already been done and possible outcomes. 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 I sent in small amount alprazolam for prn use.

## 2019-12-07 ENCOUNTER — Encounter (HOSPITAL_BASED_OUTPATIENT_CLINIC_OR_DEPARTMENT_OTHER): Payer: Self-pay | Admitting: Obstetrics & Gynecology

## 2019-12-07 ENCOUNTER — Other Ambulatory Visit: Payer: Self-pay

## 2019-12-12 ENCOUNTER — Other Ambulatory Visit: Payer: Self-pay | Admitting: Obstetrics & Gynecology

## 2019-12-13 ENCOUNTER — Other Ambulatory Visit: Payer: Self-pay | Admitting: Family Medicine

## 2019-12-13 DIAGNOSIS — I10 Essential (primary) hypertension: Secondary | ICD-10-CM

## 2019-12-14 ENCOUNTER — Other Ambulatory Visit (HOSPITAL_COMMUNITY)
Admission: RE | Admit: 2019-12-14 | Discharge: 2019-12-14 | Disposition: A | Discharge status: Home / Self Care | Payer: Medicare PPO | Source: Ambulatory Visit | Attending: Obstetrics & Gynecology | Admitting: Obstetrics & Gynecology

## 2019-12-14 ENCOUNTER — Encounter (HOSPITAL_BASED_OUTPATIENT_CLINIC_OR_DEPARTMENT_OTHER)
Admission: RE | Admit: 2019-12-14 | Discharge: 2019-12-14 | Disposition: A | Discharge status: Home / Self Care | Payer: Medicare PPO | Source: Ambulatory Visit | Attending: Obstetrics & Gynecology | Admitting: Obstetrics & Gynecology

## 2019-12-14 DIAGNOSIS — Z20822 Contact with and (suspected) exposure to covid-19: Secondary | ICD-10-CM | POA: Diagnosis not present

## 2019-12-14 DIAGNOSIS — N950 Postmenopausal bleeding: Secondary | ICD-10-CM | POA: Diagnosis not present

## 2019-12-14 DIAGNOSIS — Z01812 Encounter for preprocedural laboratory examination: Secondary | ICD-10-CM | POA: Diagnosis not present

## 2019-12-14 DIAGNOSIS — Z79890 Hormone replacement therapy (postmenopausal): Secondary | ICD-10-CM | POA: Diagnosis not present

## 2019-12-14 DIAGNOSIS — E039 Hypothyroidism, unspecified: Secondary | ICD-10-CM | POA: Diagnosis not present

## 2019-12-14 DIAGNOSIS — Z79899 Other long term (current) drug therapy: Secondary | ICD-10-CM | POA: Diagnosis not present

## 2019-12-14 DIAGNOSIS — I10 Essential (primary) hypertension: Secondary | ICD-10-CM | POA: Diagnosis not present

## 2019-12-14 DIAGNOSIS — F419 Anxiety disorder, unspecified: Secondary | ICD-10-CM | POA: Diagnosis not present

## 2019-12-14 LAB — SARS CORONAVIRUS 2 (TAT 6-24 HRS): SARS Coronavirus 2: NEGATIVE

## 2019-12-14 LAB — BASIC METABOLIC PANEL
Anion gap: 13 (ref 5–15)
BUN: 12 mg/dL (ref 8–23)
CO2: 20 mmol/L — ABNORMAL LOW (ref 22–32)
Calcium: 9.6 mg/dL (ref 8.9–10.3)
Chloride: 104 mmol/L (ref 98–111)
Creatinine, Ser: 0.66 mg/dL (ref 0.44–1.00)
GFR calc Af Amer: >60 mL/min (ref >60)
GFR calc non Af Amer: >60 mL/min (ref >60)
Glucose, Bld: 93 mg/dL (ref 70–99)
Potassium: 3.4 mmol/L — ABNORMAL LOW (ref 3.5–5.1)
Sodium: 137 mmol/L (ref 135–145)

## 2019-12-14 NOTE — Progress Notes (Signed)

## 2019-12-14 NOTE — Progress Notes (Signed)
EKG reviewed by Dr. Daiva Huge, will proceed with surgery as scheduled.

## 2019-12-18 ENCOUNTER — Encounter (HOSPITAL_BASED_OUTPATIENT_CLINIC_OR_DEPARTMENT_OTHER): Payer: Self-pay | Admitting: Obstetrics & Gynecology

## 2019-12-18 ENCOUNTER — Other Ambulatory Visit: Payer: Self-pay

## 2019-12-18 ENCOUNTER — Ambulatory Visit (HOSPITAL_BASED_OUTPATIENT_CLINIC_OR_DEPARTMENT_OTHER): Payer: Medicare PPO | Admitting: Certified Registered"

## 2019-12-18 ENCOUNTER — Ambulatory Visit (HOSPITAL_BASED_OUTPATIENT_CLINIC_OR_DEPARTMENT_OTHER)
Admission: RE | Admit: 2019-12-18 | Discharge: 2019-12-18 | Disposition: A | Discharge status: Home / Self Care | Payer: Medicare PPO | Attending: Obstetrics & Gynecology | Admitting: Obstetrics & Gynecology

## 2019-12-18 ENCOUNTER — Encounter (HOSPITAL_BASED_OUTPATIENT_CLINIC_OR_DEPARTMENT_OTHER)
Admission: RE | Disposition: A | Discharge status: Home / Self Care | Payer: Self-pay | Source: Home / Self Care | Attending: Obstetrics & Gynecology

## 2019-12-18 DIAGNOSIS — Z79899 Other long term (current) drug therapy: Secondary | ICD-10-CM | POA: Insufficient documentation

## 2019-12-18 DIAGNOSIS — F419 Anxiety disorder, unspecified: Secondary | ICD-10-CM | POA: Diagnosis not present

## 2019-12-18 DIAGNOSIS — N950 Postmenopausal bleeding: Secondary | ICD-10-CM

## 2019-12-18 DIAGNOSIS — I10 Essential (primary) hypertension: Secondary | ICD-10-CM | POA: Insufficient documentation

## 2019-12-18 DIAGNOSIS — Z79890 Hormone replacement therapy (postmenopausal): Secondary | ICD-10-CM | POA: Diagnosis not present

## 2019-12-18 DIAGNOSIS — E039 Hypothyroidism, unspecified: Secondary | ICD-10-CM | POA: Insufficient documentation

## 2019-12-18 HISTORY — DX: Essential (primary) hypertension: I10

## 2019-12-18 HISTORY — PX: HYSTEROSCOPY WITH D & C: SHX1775

## 2019-12-18 SURGERY — DILATATION AND CURETTAGE /HYSTEROSCOPY
Anesthesia: General | Site: Vagina

## 2019-12-18 MED ORDER — LIDOCAINE HCL (CARDIAC) PF 100 MG/5ML IV SOSY
PREFILLED_SYRINGE | INTRAVENOUS | Status: DC | PRN
  Administered 2019-12-18: 60 mg via INTRAVENOUS

## 2019-12-18 MED ORDER — MIDAZOLAM HCL 5 MG/5ML IJ SOLN
INTRAMUSCULAR | Status: DC | PRN
  Administered 2019-12-18: 1 mg via INTRAVENOUS

## 2019-12-18 MED ORDER — FENTANYL CITRATE (PF) 100 MCG/2ML IJ SOLN
INTRAMUSCULAR | Status: AC
  Filled 2019-12-18: qty 2

## 2019-12-18 MED ORDER — MIDAZOLAM HCL 2 MG/2ML IJ SOLN
INTRAMUSCULAR | Status: AC
  Filled 2019-12-18: qty 2

## 2019-12-18 MED ORDER — DEXAMETHASONE SODIUM PHOSPHATE 10 MG/ML IJ SOLN
INTRAMUSCULAR | Status: AC
  Filled 2019-12-18: qty 1

## 2019-12-18 MED ORDER — OXYCODONE HCL 5 MG PO TABS: 5.0000 mg | ORAL_TABLET | Freq: Once | ORAL | Status: DC | PRN

## 2019-12-18 MED ORDER — BUPIVACAINE HCL (PF) 0.25 % IJ SOLN
INTRAMUSCULAR | Status: AC
  Filled 2019-12-18: qty 30

## 2019-12-18 MED ORDER — ONDANSETRON HCL 4 MG/2ML IJ SOLN
INTRAMUSCULAR | Status: AC
  Filled 2019-12-18: qty 2

## 2019-12-18 MED ORDER — ONDANSETRON HCL 4 MG/2ML IJ SOLN: 4.0000 mg | Freq: Once | INTRAMUSCULAR | Status: DC | PRN

## 2019-12-18 MED ORDER — LIDOCAINE 2% (20 MG/ML) 5 ML SYRINGE
INTRAMUSCULAR | Status: AC
  Filled 2019-12-18: qty 5

## 2019-12-18 MED ORDER — PROPOFOL 10 MG/ML IV BOLUS
INTRAVENOUS | Status: DC | PRN
  Administered 2019-12-18: 150 mg via INTRAVENOUS

## 2019-12-18 MED ORDER — DEXAMETHASONE SODIUM PHOSPHATE 4 MG/ML IJ SOLN
INTRAMUSCULAR | Status: DC | PRN
  Administered 2019-12-18: 5 mg via INTRAVENOUS

## 2019-12-18 MED ORDER — FENTANYL CITRATE (PF) 100 MCG/2ML IJ SOLN: 25.0000 ug | INTRAMUSCULAR | Status: DC | PRN

## 2019-12-18 MED ORDER — FENTANYL CITRATE (PF) 100 MCG/2ML IJ SOLN
INTRAMUSCULAR | Status: DC | PRN
  Administered 2019-12-18 (×2): 50 ug via INTRAVENOUS

## 2019-12-18 MED ORDER — SODIUM CHLORIDE 0.9 % IR SOLN
Status: DC | PRN
  Administered 2019-12-18: 3000 mL

## 2019-12-18 MED ORDER — LIDOCAINE-EPINEPHRINE 1 %-1:100000 IJ SOLN
INTRAMUSCULAR | Status: AC
  Filled 2019-12-18: qty 1

## 2019-12-18 MED ORDER — ONDANSETRON HCL 4 MG/2ML IJ SOLN
INTRAMUSCULAR | Status: DC | PRN
  Administered 2019-12-18: 4 mg via INTRAVENOUS

## 2019-12-18 MED ORDER — BUPIVACAINE HCL 0.25 % IJ SOLN
INTRAMUSCULAR | Status: DC | PRN
  Administered 2019-12-18: 4 mL

## 2019-12-18 MED ORDER — IBUPROFEN 600 MG PO TABS
600.0000 mg | ORAL_TABLET | Freq: Four times a day (QID) | ORAL | 1 refills | Status: DC | PRN
Start: 2019-12-18 — End: 2019-12-29

## 2019-12-18 MED ORDER — SILVER NITRATE-POT NITRATE 75-25 % EX MISC
CUTANEOUS | Status: AC
  Filled 2019-12-18: qty 10

## 2019-12-18 MED ORDER — OXYCODONE HCL 5 MG/5ML PO SOLN: 5.0000 mg | Freq: Once | ORAL | Status: DC | PRN

## 2019-12-18 MED ORDER — LACTATED RINGERS IV SOLN: INTRAVENOUS | Status: DC

## 2019-12-18 MED ORDER — ROCURONIUM BROMIDE 10 MG/ML (PF) SYRINGE
PREFILLED_SYRINGE | INTRAVENOUS | Status: AC
  Filled 2019-12-18: qty 10

## 2019-12-18 SURGICAL SUPPLY — 13 items
CATH ROBINSON RED A/P 16FR (CATHETERS) IMPLANT
GAUZE 4X4 16PLY RFD (DISPOSABLE) ×3 IMPLANT
GLOVE BIO SURGEON STRL SZ 6.5 (GLOVE) ×2 IMPLANT
GLOVE BIO SURGEONS STRL SZ 6.5 (GLOVE) ×1
GLOVE BIOGEL PI IND STRL 7.0 (GLOVE) ×2 IMPLANT
GLOVE BIOGEL PI INDICATOR 7.0 (GLOVE) ×4
GOWN STRL REUS W/TWL LRG LVL3 (GOWN DISPOSABLE) ×6 IMPLANT
KIT PROCEDURE FLUENT (KITS) ×3 IMPLANT
PACK VAGINAL MINOR WOMEN LF (CUSTOM PROCEDURE TRAY) ×3 IMPLANT
PAD OB MATERNITY 4.3X12.25 (PERSONAL CARE ITEMS) ×3 IMPLANT
PAD PREP 24X48 CUFFED NSTRL (MISCELLANEOUS) ×3 IMPLANT
SLEEVE SCD COMPRESS KNEE MED (MISCELLANEOUS) ×3 IMPLANT
TOWEL GREEN STERILE FF (TOWEL DISPOSABLE) ×6 IMPLANT

## 2019-12-18 NOTE — Anesthesia Preprocedure Evaluation (Signed)
Anesthesia Evaluation  Patient identified by MRN, date of birth, ID band Patient awake    Reviewed: Allergy & Precautions, NPO status , Patient's Chart, lab work & pertinent test results  History of Anesthesia Complications Negative for: history of anesthetic complications  Airway Mallampati: III  TM Distance: >3 FB Neck ROM: Full    Dental  (+) Teeth Intact, Caps   Pulmonary neg pulmonary ROS,    Pulmonary exam normal        Cardiovascular hypertension, Pt. on medications Normal cardiovascular exam     Neuro/Psych Anxiety negative neurological ROS     GI/Hepatic negative GI ROS, Neg liver ROS,   Endo/Other  Hypothyroidism   Renal/GU negative Renal ROS  negative genitourinary   Musculoskeletal negative musculoskeletal ROS (+)   Abdominal   Peds  Hematology negative hematology ROS (+)   Anesthesia Other Findings   Reproductive/Obstetrics                            Anesthesia Physical Anesthesia Plan  ASA: II  Anesthesia Plan: General   Post-op Pain Management:    Induction: Intravenous  PONV Risk Score and Plan: 3 and Ondansetron, Dexamethasone, Midazolam and Treatment may vary due to age or medical condition  Airway Management Planned: LMA  Additional Equipment: None  Intra-op Plan:   Post-operative Plan: Extubation in OR  Informed Consent: I have reviewed the patients History and Physical, chart, labs and discussed the procedure including the risks, benefits and alternatives for the proposed anesthesia with the patient or authorized representative who has indicated his/her understanding and acceptance.     Dental advisory given  Plan Discussed with:   Anesthesia Plan Comments:         Anesthesia Quick Evaluation

## 2019-12-18 NOTE — Discharge Instructions (Signed)
Hysteroscopy, Care After This sheet gives you information about how to care for yourself after your procedure. Your health care provider may also give you more specific instructions. If you have problems or questions, contact your health care provider. What can I expect after the procedure? After the procedure, it is common to have:  Cramping.  Bleeding. This can vary from light spotting to menstrual-like bleeding. Follow these instructions at home: Activity  Rest for 1-2 days after the procedure.  Do not douche, use tampons, or have sex for 2 weeks after the procedure, or until your health care provider approves.  Do not drive for 24 hours after the procedure, or for as long as told by your health care provider.  Do not drive, use heavy machinery, or drink alcohol while taking prescription pain medicines. Medicines   Take over-the-counter and prescription medicines only as told by your health care provider.  Do not take aspirin during recovery. It can increase the risk of bleeding. General instructions  Do not take baths, swim, or use a hot tub until your health care provider approves. Take showers instead of baths for 2 weeks, or for as long as told by your health care provider.  To prevent or treat constipation while you are taking prescription pain medicine, your health care provider may recommend that you: ? Drink enough fluid to keep your urine clear or pale yellow. ? Take over-the-counter or prescription medicines. ? Eat foods that are high in fiber, such as fresh fruits and vegetables, whole grains, and beans. ? Limit foods that are high in fat and processed sugars, such as fried and sweet foods.  Keep all follow-up visits as told by your health care provider. This is important. Contact a health care provider if:  You feel dizzy or lightheaded.  You feel nauseous.  You have abnormal vaginal discharge.  You have a rash.  You have pain that does not get better with  medicine.  You have chills. Get help right away if:  You have bleeding that is heavier than a normal menstrual period.  You have a fever.  You have pain or cramps that get worse.  You develop new abdominal pain.  You faint.  You have pain in your shoulders.  You have shortness of breath. Summary  After the procedure, you may have cramping and some vaginal bleeding.  Do not douche, use tampons, or have sex for 2 weeks after the procedure, or until your health care provider approves.  Do not take baths, swim, or use a hot tub until your health care provider approves. Take showers instead of baths for 2 weeks, or for as long as told by your health care provider.  Report any unusual symptoms to your health care provider.  Keep all follow-up visits as told by your health care provider. This is important. This information is not intended to replace advice given to you by your health care provider. Make sure you discuss any questions you have with your health care provider. Document Revised: 06/04/2017 Document Reviewed: 07/21/2016 Elsevier Patient Education  2020 [employer]    Post Anesthesia Home Care Instructions  Activity: Get plenty of rest for the remainder of the day. A responsible individual must stay with you for 24 hours following the procedure.  For the next 24 hours, DO NOT: -Drive a car -Operate machinery -Drink alcoholic beverages -Take any medication unless instructed by your physician -Make any legal decisions or sign important papers.  Meals: Start with liquid   foods such as gelatin or soup. Progress to regular foods as tolerated. Avoid greasy, spicy, heavy foods. If nausea and/or vomiting occur, drink only clear liquids until the nausea and/or vomiting subsides. Call your physician if vomiting continues.  Special Instructions/Symptoms: Your throat may feel dry or sore from the anesthesia or the breathing tube placed in your throat during surgery. If this  causes discomfort, gargle with warm salt water. The discomfort should disappear within 24 hours.       

## 2019-12-18 NOTE — Transfer of Care (Signed)
Immediate Anesthesia Transfer of Care Note  Patient: Betty Sanchez  Procedure(s) Performed: DILATATION AND CURETTAGE /HYSTEROSCOPY (N/A Vagina )  Patient Location: PACU  Anesthesia Type:General  Level of Consciousness: awake and sedated  Airway & Oxygen Therapy: Patient Spontanous Breathing and Patient connected to face mask oxygen  Post-op Assessment: Report given to RN and Post -op Vital signs reviewed and stable  Post vital signs: Reviewed and stable  Last Vitals:  Vitals Value Taken Time  BP 116/73 12/18/19 1022  Temp    Pulse 89 12/18/19 1025  Resp 20 12/18/19 1025  SpO2 100 % 12/18/19 1025  Vitals shown include unvalidated device data.  Last Pain:  Vitals:   12/18/19 0829  TempSrc: Oral  PainSc: 0-No pain      Patients Stated Pain Goal: 3 (28/20/60 1561)  Complications: No complications documented.

## 2019-12-18 NOTE — Op Note (Signed)
PREOPERATIVE DIAGNOSIS:  Postmenopausal uterine bleeding POSTOPERATIVE DIAGNOSIS: The same PROCEDURE: Hysteroscopy, Dilation and Curettage. SURGEON:  Dr. [name]   INDICATIONS: 74 y.o. G0P0000  here for scheduled surgery for the aforementioned diagnoses.   Risks of surgery were discussed with the patient including but not limited to: bleeding which may require transfusion; infection which may require antibiotics; injury to uterus or surrounding organs; intrauterine scarring which may impair future fertility; need for additional procedures including laparotomy or laparoscopy; and other postoperative/anesthesia complications. Written informed consent was obtained.    FINDINGS:  A 4 week size uterus.  Left sided hyperplastic endometrial lesion.  Normal ostia bilaterally.  ANESTHESIA:   General, paracervical block with 10 ml of 0.25% Marcaine INTRAVENOUS FLUIDS:  1000 ml of LR FLUID DEFICITS:  625 ml of saline ESTIMATED BLOOD LOSS:  Less than 20 ml SPECIMENS: Endometrial curettings sent to pathology COMPLICATIONS:  None immediate.  PROCEDURE DETAILS:  The patient was taken to the operating room where general anesthesia was administered and was found to be adequate.  After an adequate timeout was performed, she was placed in the dorsal lithotomy position and examined; then prepped and draped in the sterile manner. A speculum was then placed in the patient's vagina and a single tooth tenaculum was applied to the anterior lip of the cervix.   A paracervical block using 10 ml of 0.25% Marcaine was administered.  The cervix was sounded to 9 cm and dilated manually with metal dilators to accommodate the 5 mm diagnostic hysteroscope.  Once the cervix was dilated, the hysteroscope was inserted under direct visualization using saline as a suspension medium.  The uterine cavity was carefully examined with the findings as noted above.   After further careful visualization of the uterine cavity, the  hysteroscope was removed under direct visualization.  A sharp curettage was then performed to obtain a small amount of endometrial curettings.  The tenaculum was removed from the anterior lip of the cervix and the vaginal speculum was removed after noting good hemostasis.  The patient tolerated the procedure well and was taken to the recovery area awake, extubated and in stable condition.  The patient will be discharged to home as per PACU criteria.  Routine postoperative instructions given.  She was prescribed  Ibuprofen.  She will follow up in the clinic in 2 weeks for postoperative evaluation.  Woodroe Mode, MD 12/18/2019 11:31 AM

## 2019-12-18 NOTE — Anesthesia Postprocedure Evaluation (Signed)
Anesthesia Post Note  Patient: Betty Sanchez  Procedure(s) Performed: DILATATION AND CURETTAGE /HYSTEROSCOPY (N/A Vagina )     Patient location during evaluation: PACU Anesthesia Type: General Level of consciousness: awake and alert Pain management: pain level controlled Vital Signs Assessment: post-procedure vital signs reviewed and stable Respiratory status: spontaneous breathing, nonlabored ventilation and respiratory function stable Cardiovascular status: blood pressure returned to baseline and stable Postop Assessment: no apparent nausea or vomiting Anesthetic complications: no   No complications documented.  Last Vitals:  Vitals:   12/18/19 1038 12/18/19 1045  BP: 128/79 129/65  Pulse: 87 89  Resp: 15 16  Temp:  37.2 C  SpO2: 100% 97%    Last Pain:  Vitals:   12/18/19 1045  TempSrc:   PainSc: 0-No pain                 Lynda Rainwater

## 2019-12-18 NOTE — Anesthesia Procedure Notes (Signed)
Procedure Name: LMA Insertion Performed by: Willo Yoon, [place], CRNA Pre-anesthesia Checklist: Patient identified, Emergency Drugs available, Suction available and Patient being monitored Patient Re-evaluated:Patient Re-evaluated prior to induction Oxygen Delivery Method: Circle system utilized Preoxygenation: Pre-oxygenation with 100% oxygen Induction Type: IV induction Ventilation: Mask ventilation without difficulty LMA: LMA inserted LMA Size: 4.0 Number of attempts: 1 Airway Equipment and Method: Bite block Placement Confirmation: positive ETCO2 Tube secured with: Tape Dental Injury: Teeth and Oropharynx as per pre-operative assessment        

## 2019-12-18 NOTE — H&P (Signed)
Betty Sanchez is an 74 y.o. female. G0P0000 Patient is schduled for [facility] hysteroscopy for postmenopausal bleeding.  Pertinent Gynecological History: Menses: post-menopausal Bleeding: post menopausal bleeding     No LMP recorded. Patient is postmenopausal.    Past Medical History:  Diagnosis Date  . Cataract 2019   bilateral eyes; corrected with surgery  . Fall 12/2015   multiple falls in week   . Hypertension     Past Surgical History:  Procedure Laterality Date  . APPENDECTOMY    . CATARACT EXTRACTION W/ INTRAOCULAR LENS  IMPLANT, BILATERAL  2019  . TONSILLECTOMY      History reviewed. No pertinent family history.  Social History:  reports that she has never smoked. She has never used smokeless tobacco. She reports that she does not drink alcohol and does not use drugs.  Allergies: No Known Allergies  Medications Prior to Admission  Medication Sig Dispense Refill Last Dose  . ALPRAZolam (XANAX) 1 MG tablet Take 0.5-1 tablets (0.5-1 mg total) by mouth 2 (two) times daily as needed for anxiety. 20 tablet 0 12/17/2019 at Unknown time  . amLODipine (NORVASC) 10 MG tablet TAKE 1 TABLET(10 MG) BY MOUTH DAILY 90 tablet 2 12/18/2019 at 0600  . hydrochlorothiazide (HYDRODIURIL) 12.5 MG tablet Take 1 tablet (12.5 mg total) by mouth daily. 30 tablet 2 12/17/2019 at Unknown time  . levothyroxine (SYNTHROID) 25 MCG tablet Take 1 tablet (25 mcg total) by mouth daily before breakfast. 30 tablet 2 12/17/2019 at Unknown time  . megestrol (MEGACE) 40 MG tablet Take 1 tablet (40 mg total) by mouth daily. 30 tablet 2 12/17/2019 at Unknown time    Review of Systems  Constitutional: Negative.   Respiratory: Negative.   Cardiovascular: Negative.   Gastrointestinal: Negative.   Genitourinary: Negative for vaginal bleeding.    Blood pressure (!) 161/83, pulse (!) 125, temperature (!) 97.5 F (36.4 C), temperature source Oral, resp. rate 18, height 5\' 6"  (1.676 m), weight 73.5 kg, SpO2 100  %. Physical Exam  Constitutional: She does not appear ill.  HENT:  Head: Normocephalic.  Cardiovascular: Tachycardia present.  Respiratory: Effort normal.  GI: She exhibits no distension.  Musculoskeletal:     Cervical back: Normal range of motion.  Neurological: She is alert.  Psychiatric: Mood normal.   CBC    Component Value Date/Time   WBC 11.3 (H) 10/21/2019 2028   RBC 5.28 (H) 10/21/2019 2028   HGB 15.9 (H) 10/21/2019 2028   HCT 47.6 (H) 10/21/2019 2028   PLT 321 10/21/2019 2028   MCV 90.2 10/21/2019 2028   MCH 30.1 10/21/2019 2028   MCHC 33.4 10/21/2019 2028   RDW 13.1 10/21/2019 2028   LYMPHSABS 2.2 09/11/2019 0810   MONOABS 0.7 09/11/2019 0810   EOSABS 0.1 09/11/2019 0810   BASOSABS 0.1 09/11/2019 0810    No results found for this or any previous visit (from the past 24 hour(s)).  No results found.  Assessment/Plan: PMP bleeding Patient desires surgical management with hysteroscopy and D&C.  The risks of surgery were discussed in detail with the patient including but not limited to: bleeding which may require transfusion or reoperation; infection which may require prolonged hospitalization or re-hospitalization and antibiotic therapy; injury to bowel, bladder, ureters and major vessels or other surrounding organs; formation of adhesions; need for additional procedures including laparotomy; thromboembolic phenomenon; incisional problems and other postoperative or anesthesia complications.  Patient was told that the likelihood that her condition and symptoms will be treated effectively with this surgical  management was very high; the postoperative expectations were also discussed in detail. The patient also understands the alternative treatment options which were discussed in full. All questions were answered.   [name] 12/18/2019, 9:07 AM

## 2019-12-19 ENCOUNTER — Encounter (HOSPITAL_BASED_OUTPATIENT_CLINIC_OR_DEPARTMENT_OTHER): Payer: Self-pay | Admitting: Obstetrics & Gynecology

## 2019-12-20 LAB — SURGICAL PATHOLOGY

## 2019-12-22 ENCOUNTER — Encounter: Payer: Self-pay | Admitting: Family Medicine

## 2019-12-22 ENCOUNTER — Telehealth: Payer: Self-pay | Admitting: Gynecologic Oncology

## 2019-12-22 ENCOUNTER — Telehealth: Payer: Self-pay | Admitting: *Deleted

## 2019-12-22 NOTE — Telephone Encounter (Signed)
My office received a call from Dr. Roselie Awkward earlier today about the patient that he was wanting to refer.  Briefly, this is a patient who is being worked up for postmenopausal bleeding who recently was taken to the operating room after an endometrial biopsy was nondiagnostic for hysteroscopy and D&C.  A lesion was visualized in the uterus that was hyperplastic in appearance with some atypical blood vessels.  The D&C pathology revealed benign squamous and glandular epithelium, no malignancy.  The pathologist comment that there may not be sufficient endometrial sample.  Dr. Roselie Awkward called given his concern based on the appearance of the lesion that he saw on the left aspect of the endometrial lining.  During a conversation with my nurse practitioner, Melissa cross, it was indicated that the patient was aware that my office was going to be contacted and that we could reach out to the patient to get her scheduled to see [place].  Our scheduler reached out to the patient who was quite upset and confused as to why she was being contacted.  She had been told "that if no cancer was found, she would not have to see an oncologist."  At this time, I briefly spoke to the patient and relayed my understanding of why she was being referred to our office.  I apologized that we were under the impression she knew she might be contacted by my office.  At this time, since I had not had time to fully were view her records, I asked if I could call her back when I had more information.  After review of her records, as noted above, I called the patient back to clarify the reason for her referral to [place].  I stated that I thought because of the appearance of the lesion within the uterus and possible failure to sample this area, Dr. Roselie Awkward was hoping for consultation.  The patient again stated she was very confused as "both myself and my POA were told that all of the lesion was removed."  I apologize for any confusion and suggested that the  patient try to reach out to Dr. Roselie Awkward in his office again, which she has done multiple times today.  I briefly discussed that I would recommend further evaluation based on the appearance of the lesion and my concern that it had not been sampled yet.  I would recommend either hysteroscopic biopsy and removal of this mass versus hysterectomy.  We also discussed the option of close surveillance with repeat imaging or other action in the event of worsening bleeding or other symptoms.  I would not favor surveillance which the patient understood.  After our discussion, she was amenable to scheduling an appointment to see me next week.  She was transferred to our scheduler, Sharyn Lull, to make this appointment.  Jeral Pinch MD Gynecologic Oncology

## 2019-12-22 NOTE — Telephone Encounter (Signed)
Scheduled the patient to see Dr Berline Lopes next Friday

## 2019-12-22 NOTE — Telephone Encounter (Signed)
Called the patient to schedule a new patient referral appt per [facility] APP from Dr Roselie Awkward. The patient was very upset that I was calling her to schedule an appt; it was her understanding that "Dr Roselie Awkward had got everything and she had no cancer. He was just going to have oncology review her chart. I was told that if everything was taken and no cancer diagnosis, I would not be referred to oncology." Patient did ask what the appt detailed and I explained that "it was a consultation and an exam." Patient stated that "I don't want an exam I am still recovering from the surgery. I am very unsettled and confused. I would like some clarification. Can I get a phone or virtual visit with Dr Berline Lopes." Explained that we can schedule either appt that best fixes her and her schedule.Patient stated that "I really would like to have some clarification today." I explained that I can reach out to Dr Jordan Hawks office to reach back out to her regarding her need for this appt. Patient asked if "Dr Roselie Awkward and spoke with Dr Berline Lopes and if Dr Berline Lopes had seen her records" Explained that both Melissa APP and Dr Berline Lopes have seen and reviewed her records. Patient then asked "I can't seem to get ahold or a call back from either Dr Roselie Awkward or his office. Is it possible to talk with Dr Berline Lopes or Lenna Sciara APP for a few minutes. Dr Berline Lopes spoke with the patient.

## 2019-12-28 NOTE — H&P (View-Only) (Signed)
GYNECOLOGIC ONCOLOGY NEW PATIENT CONSULTATION   Patient Name: Betty Sanchez  Patient Age: 74 y.o. Date of Service: 6/25 Referring Provider: Dr. Roselie Awkward  Primary Care Provider: Elby Beck, [place] Consulting Provider: Jeral Pinch, MD   Assessment/Plan:  Postmenopausal patient with postmenopausal bleeding that developed in April and hyperplastic appearing lesion on recent hysteroscopy but with 2 endometrial samplings showing scant or no endometrial tissue.  The patient and I discussed in detail her work-up to date including ultrasound, endometrial biopsy in the clinic, and recent hysteroscopy with D&C.  Based on the pictures that I am able to see if on the time of surgery, I agree that the lesion on the wall of her endometrium is concerning.  I do not believe that it has been adequately sampled to rule out a precancerous or malignant process.  We reviewed treatment options in terms of achieving a definitive diagnosis.  These would include hysteroscopy with biopsy under direct visualization under light anesthesia or hysterectomy with plan for frozen section.  The most minimally invasive approach would be to move forward with hysteroscopy and sampling.  If no precancer or cancer found, and the mass is adequately sampled, then no further intervention need be undertaken.  If a precancer or cancerous diagnosis is made, then we discussed that standard of care would be to proceed with surgery in the form of a total hysterectomy and bilateral salpingo-oophorectomy.  We also discussed the importance of lymph node sampling if this is a malignancy.  I offered 2 different surgical approaches, one being a two-step approach where we go to the operating room for further tissue sampling and then await final pathology.  Similarly, we could plan a concurrent procedure where in I send a biopsy for frozen section at the time of her endometrial sampling and if this comes back showing precancer or cancer, then  proceeding with robotic staging surgery.  While frozen pathology is usually able to identify whether malignancy is present or not, we discussed the limitations of intraoperative frozen section.  We reviewed the nature of endometrial cancer and its recommended surgical staging, including total hysterectomy, bilateral salpingo-oophorectomy, and lymph node assessment. The patient is a suitable candidate for staging via a minimally invasive approach to surgery.  We reviewed that robotic assistance would be used to complete the surgery if she is ultimately diagnosed with endometrial cancer.  We discussed that most endometrial cancer is detected early and that decisions regarding adjuvant therapy would be made based on her final pathology.   We reviewed the sentinel lymph node technique. Risks and benefits of sentinel lymph node biopsy was reviewed. We reviewed the technique and ICG dye. The patient DOES NOT have an iodine allergy or known liver dysfunction. We reviewed the false negative rate (0.4%), and that 3% of patients with metastatic disease will not have it detected by SLN biopsy in endometrial cancer. A low risk of allergic reaction to the dye, <0.2% for ICG, has been reported. We also discussed that in the case of failed mapping, which occurs 40% of the time, a bilateral or unilateral lymphadenectomy will be performed at the surgeon's discretion.   Potential benefits of sentinel nodes including a higher detection rate for metastasis due to ultrastaging and potential reduction in operative morbidity. However, there remains uncertainty as to the role for treatment of micrometastatic disease. Further, the benefit of operative morbidity associated with the SLN technique in endometrial cancer is not yet completely known. In other patient populations (e.g. the cervical cancer population) there  has been observed reductions in morbidity with SLN biopsy compared to pelvic lymphadenectomy. Lymphedema, nerve  dysfunction and lymphocysts are all potential risks with the SLN technique as with complete lymphadenectomy. Additional risks to the patient include the risk of damage to an internal organ while operating in an altered view (e.g. the black and white image of the robotic fluorescence imaging mode).   After an extensive discussion about possible options, including no further work-up (which I do not recommend), the patient is amenable to proceeding with further endometrial sampling under light anesthesia.  She would like to travel to [place] for work and plan to do the procedure at the outpatient surgical center the week of July 19.  I will have my office call her next week to review perioperative instructions over the phone and to set a date for surgery.  She will need repeat Covid testing as well as a type and screen but otherwise does not require preoperative work-up.  The patient has additional refills for Megace.  While she is traveling, we discussed that if she were to start having vaginal bleeding, it would be safe for her to start Megace again to help stop her bleeding.  A copy of this note was sent to the patient's referring provider.   65 minutes of total time was spent for this patient encounter, including preparation, face-to-face counseling with the patient and coordination of care, and documentation of the encounter.  Jeral Pinch, MD  Division of Gynecologic Oncology  Department of Obstetrics and Gynecology  [facility]  ___________________________________________  Chief Complaint: Chief Complaint  Patient presents with  . Postmenopausal bleeding    New Patient    History of Present Illness:  Betty Sanchez is a 74 y.o. y.o. female who is seen in consultation at the request of Dr. Roselie Awkward for an evaluation of PMB and endometrial mass.  The patient was initially seen in the ED on 4/17 with PMB.  This is the first bleeding that she has had since going  through menopause in her early 31s.  A pelvic ultrasound showed an endometrial mass. She was started on megace at the time of her ED visit and her bleeding basically resolved within 2 or 3 days.  She then saw Dr. Roselie Awkward on 4/22. Unfortunately, EMB in the clinic was not diagnostic. She was subsequently taken to the OR on 6/14 for hysteroscopy and D&C with a left sided hyperplastic lesion noted. Curettings showed benign LUS and benign squamous and endocervical glandular epithelium.  Since the surgery, she endorses scant bleeding that has not required the use of a panty liner or pad.  She denies any abdominal pain or cramping.  She endorses a good appetite without nausea or emesis.  She reports regular bowel and bladder function.  She has had quite significant anxiety related to the work-up and failure to determine a definitive diagnosis.  The patient has been off of Megace since around the time of her procedure.  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  PAST MEDICAL HISTORY:  Past Medical History:  Diagnosis Date  . Cataract 2019   bilateral eyes; corrected with surgery  . Fall 12/2015   multiple falls in week   . Hypertension      PAST SURGICAL HISTORY:  Past Surgical History:  Procedure Laterality Date  . APPENDECTOMY    . CATARACT EXTRACTION W/ INTRAOCULAR LENS  IMPLANT, BILATERAL  2019  . HYSTEROSCOPY WITH D &  C N/A 12/18/2019   Procedure: DILATATION AND CURETTAGE /HYSTEROSCOPY;  Surgeon: Woodroe Mode, MD;  Location: [place];  Service: Gynecology;  Laterality: N/A;  . TONSILLECTOMY      OB/GYN HISTORY:  OB History  Gravida Para Term Preterm AB Living  0 0 0 0 0 0  SAB TAB Ectopic Multiple Live Births  0 0 0 0 0    No LMP recorded. Patient is postmenopausal.  Age at menarche: 50 Age at menopause: Early 34s Hx of HRT: Denies Hx of STDs: Denies Last pap: Unsure History of abnormal pap smears:  Denies  MEDICATIONS: Outpatient Encounter Medications as of 12/29/2019  Medication Sig  . ALPRAZolam (XANAX) 1 MG tablet Take 0.5-1 tablets (0.5-1 mg total) by mouth 2 (two) times daily as needed for anxiety.  Marland Kitchen amLODipine (NORVASC) 10 MG tablet TAKE 1 TABLET(10 MG) BY MOUTH DAILY  . hydrochlorothiazide (HYDRODIURIL) 12.5 MG tablet Take 1 tablet (12.5 mg total) by mouth daily.  Marland Kitchen levothyroxine (SYNTHROID) 25 MCG tablet Take 1 tablet (25 mcg total) by mouth daily before breakfast.  . [DISCONTINUED] ibuprofen (ADVIL) 600 MG tablet Take 1 tablet (600 mg total) by mouth every 6 (six) hours as needed. (Patient not taking: Reported on 12/29/2019)  . [DISCONTINUED] megestrol (MEGACE) 40 MG tablet Take 1 tablet (40 mg total) by mouth daily. (Patient not taking: Reported on 12/29/2019)   No facility-administered encounter medications on file as of 12/29/2019.    ALLERGIES:  No Known Allergies   FAMILY HISTORY:  Family History  Problem Relation Age of Onset  . Breast cancer Neg Hx   . Ovarian cancer Neg Hx   . Endometrial cancer Neg Hx   . Colon cancer Neg Hx      SOCIAL HISTORY:    Social Connections:   . Frequency of Communication with Friends and Family:   . Frequency of Social Gatherings with Friends and Family:   . Attends Religious Services:   . Active Member of Clubs or Organizations:   . Attends Archivist Meetings:   Marland Kitchen Marital Status:     REVIEW OF SYSTEMS:  Pertinent positives as per HPI Denies appetite changes, fevers, chills, fatigue, unexplained weight changes. Denies hearing loss, neck lumps or masses, mouth sores, ringing in ears or voice changes. Denies cough or wheezing.  Denies shortness of breath. Denies chest pain or palpitations. Denies leg swelling. Denies abdominal distention, pain, blood in stools, constipation, diarrhea, nausea, vomiting, or early satiety. Denies pain with intercourse, dysuria, frequency, hematuria or incontinence. Denies hot  flashes, pelvic pain, or vaginal discharge.   Denies joint pain, back pain or muscle pain/cramps. Denies itching, rash, or wounds. Denies dizziness, headaches, numbness or seizures. Denies swollen lymph nodes or glands, denies easy bruising or bleeding. Denies depression, confusion, or decreased concentration.  Physical Exam:  Vital Signs for this encounter:  Blood pressure (!) 153/81, temperature 98.6 F (37 C), temperature source Oral, resp. rate 20, height 5\' 6"  (1.676 m), weight 163 lb 3.2 oz (74 kg), SpO2 100 %. Body mass index is 26.34 kg/m. General: Alert, oriented, no acute distress.  HEENT: Normocephalic, atraumatic. Sclera anicteric.  Chest: Clear to auscultation bilaterally. No wheezes, rhonchi, or rales. Cardiovascular: Regular rate and rhythm, no murmurs, rubs, or gallops.  Extremities: Grossly normal range of motion. Warm, well perfused. No edema bilaterally.  Skin: No rashes or lesions.  Lymphatics: No cervical, supraclavicular adenopathy.  GU: Deferred until the OR.  LABORATORY AND RADIOLOGIC DATA:  Outside medical  records were reviewed to synthesize the above history, along with the history and physical obtained during the visit.   Lab Results  Component Value Date   WBC 11.3 (H) 10/21/2019   HGB 15.9 (H) 10/21/2019   HCT 47.6 (H) 10/21/2019   PLT 321 10/21/2019   GLUCOSE 93 12/14/2019   CHOL 240 (H) 09/11/2019   TRIG 124.0 09/11/2019   HDL 69.40 09/11/2019   LDLCALC 146 (H) 09/11/2019   ALT 15 10/21/2019   AST 21 10/21/2019   NA 137 12/14/2019   K 3.4 (L) 12/14/2019   CL 104 12/14/2019   CREATININE 0.66 12/14/2019   BUN 12 12/14/2019   CO2 20 (L) 12/14/2019   TSH 11.170 (H) 10/21/2019   HGBA1C 5.1 09/11/2019   EMB on 4/22: A. ENDOMETRIUM, BIOPSY:  - Scant benign endocervical glandular type epithelium present in the  background of a majority of blood.  - No distinct endometrial tissue present for evaluation.   D&C on 6/14: A. ENDOMETRIUM,  CURETTAGE:  - Benign squamous and endocervical glandular epithelium with  microglandular adenosis  - Benign lower uterine segment  - No malignancy identified  - See comment  COMMENT:  Sample may not be representative of the endometrium; clinical  correlation recommended. Dr. Jeannie Done reviewed the case and agrees with  the above diagnosis.   Pelvic ultrasound 4/17: Uterus Measurements: 8.0 x 5.1 by 5.3 cm = volume: 113 mL. No fibroids or other mass visualized. Endometrium Thickness: 3.8 cm. There is masslike heterogeneous thickening of the endometrium measuring 4.2 x 5.6 x 3.8 cm in total. Endometrial neoplasm is not excluded, and endometrial sampling is recommended. Right ovary Not visualized Left ovary Not visualized Other findings No abnormal free fluid. IMPRESSION: 1. Heterogeneous masslike thickening of the endometrium, highly concerning for endometrial neoplasm. Endometrial sampling is recommended. 2. Nonvisualization of the ovaries, which may be atrophic.

## 2019-12-28 NOTE — H&P (View-Only) (Signed)
GYNECOLOGIC ONCOLOGY NEW PATIENT CONSULTATION   Patient Name: Betty Sanchez  Patient Age: 74 y.o. Date of Service: 6/25 Referring Provider: Dr. Roselie Awkward  Primary Care Provider: Elby Beck, [place] Consulting Provider: Jeral Pinch, MD   Assessment/Plan:  Postmenopausal patient with postmenopausal bleeding that developed in April and hyperplastic appearing lesion on recent hysteroscopy but with 2 endometrial samplings showing scant or no endometrial tissue.  The patient and I discussed in detail her work-up to date including ultrasound, endometrial biopsy in the clinic, and recent hysteroscopy with D&C.  Based on the pictures that I am able to see if on the time of surgery, I agree that the lesion on the wall of her endometrium is concerning.  I do not believe that it has been adequately sampled to rule out a precancerous or malignant process.  We reviewed treatment options in terms of achieving a definitive diagnosis.  These would include hysteroscopy with biopsy under direct visualization under light anesthesia or hysterectomy with plan for frozen section.  The most minimally invasive approach would be to move forward with hysteroscopy and sampling.  If no precancer or cancer found, and the mass is adequately sampled, then no further intervention need be undertaken.  If a precancer or cancerous diagnosis is made, then we discussed that standard of care would be to proceed with surgery in the form of a total hysterectomy and bilateral salpingo-oophorectomy.  We also discussed the importance of lymph node sampling if this is a malignancy.  I offered 2 different surgical approaches, one being a two-step approach where we go to the operating room for further tissue sampling and then await final pathology.  Similarly, we could plan a concurrent procedure where in I send a biopsy for frozen section at the time of her endometrial sampling and if this comes back showing precancer or cancer, then  proceeding with robotic staging surgery.  While frozen pathology is usually able to identify whether malignancy is present or not, we discussed the limitations of intraoperative frozen section.  We reviewed the nature of endometrial cancer and its recommended surgical staging, including total hysterectomy, bilateral salpingo-oophorectomy, and lymph node assessment. The patient is a suitable candidate for staging via a minimally invasive approach to surgery.  We reviewed that robotic assistance would be used to complete the surgery if she is ultimately diagnosed with endometrial cancer.  We discussed that most endometrial cancer is detected early and that decisions regarding adjuvant therapy would be made based on her final pathology.   We reviewed the sentinel lymph node technique. Risks and benefits of sentinel lymph node biopsy was reviewed. We reviewed the technique and ICG dye. The patient DOES NOT have an iodine allergy or known liver dysfunction. We reviewed the false negative rate (0.4%), and that 3% of patients with metastatic disease will not have it detected by SLN biopsy in endometrial cancer. A low risk of allergic reaction to the dye, <0.2% for ICG, has been reported. We also discussed that in the case of failed mapping, which occurs 40% of the time, a bilateral or unilateral lymphadenectomy will be performed at the surgeon's discretion.   Potential benefits of sentinel nodes including a higher detection rate for metastasis due to ultrastaging and potential reduction in operative morbidity. However, there remains uncertainty as to the role for treatment of micrometastatic disease. Further, the benefit of operative morbidity associated with the SLN technique in endometrial cancer is not yet completely known. In other patient populations (e.g. the cervical cancer population) there  has been observed reductions in morbidity with SLN biopsy compared to pelvic lymphadenectomy. Lymphedema, nerve  dysfunction and lymphocysts are all potential risks with the SLN technique as with complete lymphadenectomy. Additional risks to the patient include the risk of damage to an internal organ while operating in an altered view (e.g. the black and white image of the robotic fluorescence imaging mode).   After an extensive discussion about possible options, including no further work-up (which I do not recommend), the patient is amenable to proceeding with further endometrial sampling under light anesthesia.  She would like to travel to [place] for work and plan to do the procedure at the outpatient surgical center the week of July 19.  I will have my office call her next week to review perioperative instructions over the phone and to set a date for surgery.  She will need repeat Covid testing as well as a type and screen but otherwise does not require preoperative work-up.  The patient has additional refills for Megace.  While she is traveling, we discussed that if she were to start having vaginal bleeding, it would be safe for her to start Megace again to help stop her bleeding.  A copy of this note was sent to the patient's referring provider.   65 minutes of total time was spent for this patient encounter, including preparation, face-to-face counseling with the patient and coordination of care, and documentation of the encounter.  Jeral Pinch, MD  Division of Gynecologic Oncology  Department of Obstetrics and Gynecology  [facility]  ___________________________________________  Chief Complaint: Chief Complaint  Patient presents with  . Postmenopausal bleeding    New Patient    History of Present Illness:  Betty Sanchez is a 74 y.o. y.o. female who is seen in consultation at the request of Dr. Roselie Awkward for an evaluation of PMB and endometrial mass.  The patient was initially seen in the ED on 4/17 with PMB.  This is the first bleeding that she has had since going  through menopause in her early 24s.  A pelvic ultrasound showed an endometrial mass. She was started on megace at the time of her ED visit and her bleeding basically resolved within 2 or 3 days.  She then saw Dr. Roselie Awkward on 4/22. Unfortunately, EMB in the clinic was not diagnostic. She was subsequently taken to the OR on 6/14 for hysteroscopy and D&C with a left sided hyperplastic lesion noted. Curettings showed benign LUS and benign squamous and endocervical glandular epithelium.  Since the surgery, she endorses scant bleeding that has not required the use of a panty liner or pad.  She denies any abdominal pain or cramping.  She endorses a good appetite without nausea or emesis.  She reports regular bowel and bladder function.  She has had quite significant anxiety related to the work-up and failure to determine a definitive diagnosis.  The patient has been off of Megace since around the time of her procedure.  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  PAST MEDICAL HISTORY:  Past Medical History:  Diagnosis Date  . Cataract 2019   bilateral eyes; corrected with surgery  . Fall 12/2015   multiple falls in week   . Hypertension      PAST SURGICAL HISTORY:  Past Surgical History:  Procedure Laterality Date  . APPENDECTOMY    . CATARACT EXTRACTION W/ INTRAOCULAR LENS  IMPLANT, BILATERAL  2019  . HYSTEROSCOPY WITH D &  C N/A 12/18/2019   Procedure: DILATATION AND CURETTAGE /HYSTEROSCOPY;  Surgeon: Woodroe Mode, MD;  Location: [facility];  Service: Gynecology;  Laterality: N/A;  . TONSILLECTOMY      OB/GYN HISTORY:  OB History  Gravida Para Term Preterm AB Living  0 0 0 0 0 0  SAB TAB Ectopic Multiple Live Births  0 0 0 0 0    No LMP recorded. Patient is postmenopausal.  Age at menarche: 74 Age at menopause: Early 46s Hx of HRT: Denies Hx of STDs: Denies Last pap: Unsure History of abnormal pap smears:  Denies  MEDICATIONS: Outpatient Encounter Medications as of 12/29/2019  Medication Sig  . ALPRAZolam (XANAX) 1 MG tablet Take 0.5-1 tablets (0.5-1 mg total) by mouth 2 (two) times daily as needed for anxiety.  Marland Kitchen amLODipine (NORVASC) 10 MG tablet TAKE 1 TABLET(10 MG) BY MOUTH DAILY  . hydrochlorothiazide (HYDRODIURIL) 12.5 MG tablet Take 1 tablet (12.5 mg total) by mouth daily.  Marland Kitchen levothyroxine (SYNTHROID) 25 MCG tablet Take 1 tablet (25 mcg total) by mouth daily before breakfast.  . [DISCONTINUED] ibuprofen (ADVIL) 600 MG tablet Take 1 tablet (600 mg total) by mouth every 6 (six) hours as needed. (Patient not taking: Reported on 12/29/2019)  . [DISCONTINUED] megestrol (MEGACE) 40 MG tablet Take 1 tablet (40 mg total) by mouth daily. (Patient not taking: Reported on 12/29/2019)   No facility-administered encounter medications on file as of 12/29/2019.    ALLERGIES:  No Known Allergies   FAMILY HISTORY:  Family History  Problem Relation Age of Onset  . Breast cancer Neg Hx   . Ovarian cancer Neg Hx   . Endometrial cancer Neg Hx   . Colon cancer Neg Hx      SOCIAL HISTORY:    Social Connections:   . Frequency of Communication with Friends and Family:   . Frequency of Social Gatherings with Friends and Family:   . Attends Religious Services:   . Active Member of Clubs or Organizations:   . Attends Archivist Meetings:   Marland Kitchen Marital Status:     REVIEW OF SYSTEMS:  Pertinent positives as per HPI Denies appetite changes, fevers, chills, fatigue, unexplained weight changes. Denies hearing loss, neck lumps or masses, mouth sores, ringing in ears or voice changes. Denies cough or wheezing.  Denies shortness of breath. Denies chest pain or palpitations. Denies leg swelling. Denies abdominal distention, pain, blood in stools, constipation, diarrhea, nausea, vomiting, or early satiety. Denies pain with intercourse, dysuria, frequency, hematuria or incontinence. Denies hot  flashes, pelvic pain, or vaginal discharge.   Denies joint pain, back pain or muscle pain/cramps. Denies itching, rash, or wounds. Denies dizziness, headaches, numbness or seizures. Denies swollen lymph nodes or glands, denies easy bruising or bleeding. Denies depression, confusion, or decreased concentration.  Physical Exam:  Vital Signs for this encounter:  Blood pressure (!) 153/81, temperature 98.6 F (37 C), temperature source Oral, resp. rate 20, height 5\' 6"  (1.676 m), weight 163 lb 3.2 oz (74 kg), SpO2 100 %. Body mass index is 26.34 kg/m. General: Alert, oriented, no acute distress.  HEENT: Normocephalic, atraumatic. Sclera anicteric.  Chest: Clear to auscultation bilaterally. No wheezes, rhonchi, or rales. Cardiovascular: Regular rate and rhythm, no murmurs, rubs, or gallops.  Extremities: Grossly normal range of motion. Warm, well perfused. No edema bilaterally.  Skin: No rashes or lesions.  Lymphatics: No cervical, supraclavicular adenopathy.  GU: Deferred until the OR.  LABORATORY AND RADIOLOGIC DATA:  Outside medical  records were reviewed to synthesize the above history, along with the history and physical obtained during the visit.   Lab Results  Component Value Date   WBC 11.3 (H) 10/21/2019   HGB 15.9 (H) 10/21/2019   HCT 47.6 (H) 10/21/2019   PLT 321 10/21/2019   GLUCOSE 93 12/14/2019   CHOL 240 (H) 09/11/2019   TRIG 124.0 09/11/2019   HDL 69.40 09/11/2019   LDLCALC 146 (H) 09/11/2019   ALT 15 10/21/2019   AST 21 10/21/2019   NA 137 12/14/2019   K 3.4 (L) 12/14/2019   CL 104 12/14/2019   CREATININE 0.66 12/14/2019   BUN 12 12/14/2019   CO2 20 (L) 12/14/2019   TSH 11.170 (H) 10/21/2019   HGBA1C 5.1 09/11/2019   EMB on 4/22: A. ENDOMETRIUM, BIOPSY:  - Scant benign endocervical glandular type epithelium present in the  background of a majority of blood.  - No distinct endometrial tissue present for evaluation.   D&C on 6/14: A. ENDOMETRIUM,  CURETTAGE:  - Benign squamous and endocervical glandular epithelium with  microglandular adenosis  - Benign lower uterine segment  - No malignancy identified  - See comment  COMMENT:  Sample may not be representative of the endometrium; clinical  correlation recommended. Dr. Jeannie Done reviewed the case and agrees with  the above diagnosis.   Pelvic ultrasound 4/17: Uterus Measurements: 8.0 x 5.1 by 5.3 cm = volume: 113 mL. No fibroids or other mass visualized. Endometrium Thickness: 3.8 cm. There is masslike heterogeneous thickening of the endometrium measuring 4.2 x 5.6 x 3.8 cm in total. Endometrial neoplasm is not excluded, and endometrial sampling is recommended. Right ovary Not visualized Left ovary Not visualized Other findings No abnormal free fluid. IMPRESSION: 1. Heterogeneous masslike thickening of the endometrium, highly concerning for endometrial neoplasm. Endometrial sampling is recommended. 2. Nonvisualization of the ovaries, which may be atrophic.

## 2019-12-28 NOTE — Progress Notes (Addendum)
GYNECOLOGIC ONCOLOGY NEW PATIENT CONSULTATION   Patient Name: Betty Sanchez  Patient Age: 74 y.o. Date of Service: 6/25 Referring Provider: Dr. Roselie Awkward  Primary Care Provider: Elby Beck, [place] Consulting Provider: Jeral Pinch, MD   Assessment/Plan:  Postmenopausal patient with postmenopausal bleeding that developed in April and hyperplastic appearing lesion on recent hysteroscopy but with 2 endometrial samplings showing scant or no endometrial tissue.  The patient and I discussed in detail her work-up to date including ultrasound, endometrial biopsy in the clinic, and recent hysteroscopy with D&C.  Based on the pictures that I am able to see if on the time of surgery, I agree that the lesion on the wall of her endometrium is concerning.  I do not believe that it has been adequately sampled to rule out a precancerous or malignant process.  We reviewed treatment options in terms of achieving a definitive diagnosis.  These would include hysteroscopy with biopsy under direct visualization under light anesthesia or hysterectomy with plan for frozen section.  The most minimally invasive approach would be to move forward with hysteroscopy and sampling.  If no precancer or cancer found, and the mass is adequately sampled, then no further intervention need be undertaken.  If a precancer or cancerous diagnosis is made, then we discussed that standard of care would be to proceed with surgery in the form of a total hysterectomy and bilateral salpingo-oophorectomy.  We also discussed the importance of lymph node sampling if this is a malignancy.  I offered 2 different surgical approaches, one being a two-step approach where we go to the operating room for further tissue sampling and then await final pathology.  Similarly, we could plan a concurrent procedure where in I send a biopsy for frozen section at the time of her endometrial sampling and if this comes back showing precancer or cancer, then  proceeding with robotic staging surgery.  While frozen pathology is usually able to identify whether malignancy is present or not, we discussed the limitations of intraoperative frozen section.  We reviewed the nature of endometrial cancer and its recommended surgical staging, including total hysterectomy, bilateral salpingo-oophorectomy, and lymph node assessment. The patient is a suitable candidate for staging via a minimally invasive approach to surgery.  We reviewed that robotic assistance would be used to complete the surgery if she is ultimately diagnosed with endometrial cancer.  We discussed that most endometrial cancer is detected early and that decisions regarding adjuvant therapy would be made based on her final pathology.   We reviewed the sentinel lymph node technique. Risks and benefits of sentinel lymph node biopsy was reviewed. We reviewed the technique and ICG dye. The patient DOES NOT have an iodine allergy or known liver dysfunction. We reviewed the false negative rate (0.4%), and that 3% of patients with metastatic disease will not have it detected by SLN biopsy in endometrial cancer. A low risk of allergic reaction to the dye, <0.2% for ICG, has been reported. We also discussed that in the case of failed mapping, which occurs 40% of the time, a bilateral or unilateral lymphadenectomy will be performed at the surgeon's discretion.   Potential benefits of sentinel nodes including a higher detection rate for metastasis due to ultrastaging and potential reduction in operative morbidity. However, there remains uncertainty as to the role for treatment of micrometastatic disease. Further, the benefit of operative morbidity associated with the SLN technique in endometrial cancer is not yet completely known. In other patient populations (e.g. the cervical cancer population) there  has been observed reductions in morbidity with SLN biopsy compared to pelvic lymphadenectomy. Lymphedema, nerve  dysfunction and lymphocysts are all potential risks with the SLN technique as with complete lymphadenectomy. Additional risks to the patient include the risk of damage to an internal organ while operating in an altered view (e.g. the black and white image of the robotic fluorescence imaging mode).   After an extensive discussion about possible options, including no further work-up (which I do not recommend), the patient is amenable to proceeding with further endometrial sampling under light anesthesia.  She would like to travel to [place] for work and plan to do the procedure at the outpatient surgical center the week of July 19.  I will have my office call her next week to review perioperative instructions over the phone and to set a date for surgery.  She will need repeat Covid testing as well as a type and screen but otherwise does not require preoperative work-up.  The patient has additional refills for Megace.  While she is traveling, we discussed that if she were to start having vaginal bleeding, it would be safe for her to start Megace again to help stop her bleeding.  A copy of this note was sent to the patient's referring provider.   65 minutes of total time was spent for this patient encounter, including preparation, face-to-face counseling with the patient and coordination of care, and documentation of the encounter.  Jeral Pinch, MD  Division of Gynecologic Oncology  Department of Obstetrics and Gynecology  [facility]  ___________________________________________  Chief Complaint: Chief Complaint  Patient presents with  . Postmenopausal bleeding    New Patient    History of Present Illness:  Betty Sanchez is a 74 y.o. y.o. female who is seen in consultation at the request of Dr. Roselie Awkward for an evaluation of PMB and endometrial mass.  The patient was initially seen in the ED on 4/17 with PMB.  This is the first bleeding that she has had since going  through menopause in her early 73s.  A pelvic ultrasound showed an endometrial mass. She was started on megace at the time of her ED visit and her bleeding basically resolved within 2 or 3 days.  She then saw Dr. Roselie Awkward on 4/22. Unfortunately, EMB in the clinic was not diagnostic. She was subsequently taken to the OR on 6/14 for hysteroscopy and D&C with a left sided hyperplastic lesion noted. Curettings showed benign LUS and benign squamous and endocervical glandular epithelium.  Since the surgery, she endorses scant bleeding that has not required the use of a panty liner or pad.  She denies any abdominal pain or cramping.  She endorses a good appetite without nausea or emesis.  She reports regular bowel and bladder function.  She has had quite significant anxiety related to the work-up and failure to determine a definitive diagnosis.  The patient has been off of Megace since around the time of her procedure.  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  PAST MEDICAL HISTORY:  Past Medical History:  Diagnosis Date  . Cataract 2019   bilateral eyes; corrected with surgery  . Fall 12/2015   multiple falls in week   . Hypertension      PAST SURGICAL HISTORY:  Past Surgical History:  Procedure Laterality Date  . APPENDECTOMY    . CATARACT EXTRACTION W/ INTRAOCULAR LENS  IMPLANT, BILATERAL  2019  . HYSTEROSCOPY WITH D &  C N/A 12/18/2019   Procedure: DILATATION AND CURETTAGE /HYSTEROSCOPY;  Surgeon: Woodroe Mode, MD;  Location: [facility];  Service: Gynecology;  Laterality: N/A;  . TONSILLECTOMY      OB/GYN HISTORY:  OB History  Gravida Para Term Preterm AB Living  0 0 0 0 0 0  SAB TAB Ectopic Multiple Live Births  0 0 0 0 0    No LMP recorded. Patient is postmenopausal.  Age at menarche: 80 Age at menopause: Early 2s Hx of HRT: Denies Hx of STDs: Denies Last pap: Unsure History of abnormal pap smears:  Denies  MEDICATIONS: Outpatient Encounter Medications as of 12/29/2019  Medication Sig  . ALPRAZolam (XANAX) 1 MG tablet Take 0.5-1 tablets (0.5-1 mg total) by mouth 2 (two) times daily as needed for anxiety.  Marland Kitchen amLODipine (NORVASC) 10 MG tablet TAKE 1 TABLET(10 MG) BY MOUTH DAILY  . hydrochlorothiazide (HYDRODIURIL) 12.5 MG tablet Take 1 tablet (12.5 mg total) by mouth daily.  Marland Kitchen levothyroxine (SYNTHROID) 25 MCG tablet Take 1 tablet (25 mcg total) by mouth daily before breakfast.  . [DISCONTINUED] ibuprofen (ADVIL) 600 MG tablet Take 1 tablet (600 mg total) by mouth every 6 (six) hours as needed. (Patient not taking: Reported on 12/29/2019)  . [DISCONTINUED] megestrol (MEGACE) 40 MG tablet Take 1 tablet (40 mg total) by mouth daily. (Patient not taking: Reported on 12/29/2019)   No facility-administered encounter medications on file as of 12/29/2019.    ALLERGIES:  No Known Allergies   FAMILY HISTORY:  Family History  Problem Relation Age of Onset  . Breast cancer Neg Hx   . Ovarian cancer Neg Hx   . Endometrial cancer Neg Hx   . Colon cancer Neg Hx      SOCIAL HISTORY:    Social Connections:   . Frequency of Communication with Friends and Family:   . Frequency of Social Gatherings with Friends and Family:   . Attends Religious Services:   . Active Member of Clubs or Organizations:   . Attends Archivist Meetings:   Marland Kitchen Marital Status:     REVIEW OF SYSTEMS:  Pertinent positives as per HPI Denies appetite changes, fevers, chills, fatigue, unexplained weight changes. Denies hearing loss, neck lumps or masses, mouth sores, ringing in ears or voice changes. Denies cough or wheezing.  Denies shortness of breath. Denies chest pain or palpitations. Denies leg swelling. Denies abdominal distention, pain, blood in stools, constipation, diarrhea, nausea, vomiting, or early satiety. Denies pain with intercourse, dysuria, frequency, hematuria or incontinence. Denies hot  flashes, pelvic pain, or vaginal discharge.   Denies joint pain, back pain or muscle pain/cramps. Denies itching, rash, or wounds. Denies dizziness, headaches, numbness or seizures. Denies swollen lymph nodes or glands, denies easy bruising or bleeding. Denies depression, confusion, or decreased concentration.  Physical Exam:  Vital Signs for this encounter:  Blood pressure (!) 153/81, temperature 98.6 F (37 C), temperature source Oral, resp. rate 20, height 5\' 6"  (1.676 m), weight 163 lb 3.2 oz (74 kg), SpO2 100 %. Body mass index is 26.34 kg/m. General: Alert, oriented, no acute distress.  HEENT: Normocephalic, atraumatic. Sclera anicteric.  Chest: Clear to auscultation bilaterally. No wheezes, rhonchi, or rales. Cardiovascular: Regular rate and rhythm, no murmurs, rubs, or gallops.  Extremities: Grossly normal range of motion. Warm, well perfused. No edema bilaterally.  Skin: No rashes or lesions.  Lymphatics: No cervical, supraclavicular adenopathy.  GU: Deferred until the OR.  LABORATORY AND RADIOLOGIC DATA:  Outside medical  records were reviewed to synthesize the above history, along with the history and physical obtained during the visit.   Lab Results  Component Value Date   WBC 11.3 (H) 10/21/2019   HGB 15.9 (H) 10/21/2019   HCT 47.6 (H) 10/21/2019   PLT 321 10/21/2019   GLUCOSE 93 12/14/2019   CHOL 240 (H) 09/11/2019   TRIG 124.0 09/11/2019   HDL 69.40 09/11/2019   LDLCALC 146 (H) 09/11/2019   ALT 15 10/21/2019   AST 21 10/21/2019   NA 137 12/14/2019   K 3.4 (L) 12/14/2019   CL 104 12/14/2019   CREATININE 0.66 12/14/2019   BUN 12 12/14/2019   CO2 20 (L) 12/14/2019   TSH 11.170 (H) 10/21/2019   HGBA1C 5.1 09/11/2019   EMB on 4/22: A. ENDOMETRIUM, BIOPSY:  - Scant benign endocervical glandular type epithelium present in the  background of a majority of blood.  - No distinct endometrial tissue present for evaluation.   D&C on 6/14: A. ENDOMETRIUM,  CURETTAGE:  - Benign squamous and endocervical glandular epithelium with  microglandular adenosis  - Benign lower uterine segment  - No malignancy identified  - See comment  COMMENT:  Sample may not be representative of the endometrium; clinical  correlation recommended. Dr. Jeannie Done reviewed the case and agrees with  the above diagnosis.   Pelvic ultrasound 4/17: Uterus Measurements: 8.0 x 5.1 by 5.3 cm = volume: 113 mL. No fibroids or other mass visualized. Endometrium Thickness: 3.8 cm. There is masslike heterogeneous thickening of the endometrium measuring 4.2 x 5.6 x 3.8 cm in total. Endometrial neoplasm is not excluded, and endometrial sampling is recommended. Right ovary Not visualized Left ovary Not visualized Other findings No abnormal free fluid. IMPRESSION: 1. Heterogeneous masslike thickening of the endometrium, highly concerning for endometrial neoplasm. Endometrial sampling is recommended. 2. Nonvisualization of the ovaries, which may be atrophic.

## 2019-12-29 ENCOUNTER — Other Ambulatory Visit: Payer: Self-pay

## 2019-12-29 ENCOUNTER — Inpatient Hospital Stay: Payer: Medicare PPO | Attending: Gynecologic Oncology | Admitting: Gynecologic Oncology

## 2019-12-29 ENCOUNTER — Encounter: Payer: Self-pay | Admitting: Family Medicine

## 2019-12-29 ENCOUNTER — Encounter: Payer: Self-pay | Admitting: Gynecologic Oncology

## 2019-12-29 VITALS — BP 153/81 | Temp 98.6°F | Resp 20 | Ht 66.0 in | Wt 163.2 lb

## 2019-12-29 DIAGNOSIS — N9489 Other specified conditions associated with female genital organs and menstrual cycle: Secondary | ICD-10-CM | POA: Insufficient documentation

## 2019-12-29 DIAGNOSIS — N950 Postmenopausal bleeding: Secondary | ICD-10-CM | POA: Diagnosis not present

## 2019-12-29 NOTE — Patient Instructions (Signed)
It was a pleasure meeting you today. Lenna Sciara, my nurse practitioner will call you next week with possible surgery dates for outpatient hysteroscopy and biopsy under direct visualization at the surgery center at [facility] for the week of 7/19.

## 2020-01-01 ENCOUNTER — Other Ambulatory Visit: Payer: Self-pay | Admitting: Gynecologic Oncology

## 2020-01-01 DIAGNOSIS — N950 Postmenopausal bleeding: Secondary | ICD-10-CM

## 2020-01-03 ENCOUNTER — Encounter (HOSPITAL_BASED_OUTPATIENT_CLINIC_OR_DEPARTMENT_OTHER): Payer: Self-pay | Admitting: Gynecologic Oncology

## 2020-01-03 ENCOUNTER — Other Ambulatory Visit: Payer: Self-pay

## 2020-01-03 NOTE — Progress Notes (Signed)
Spoke w/ via phone for pre-op interview---pt Lab needs dos----  none            Lab results------has lab appt 01-09-2020 for cbc, bmet, T & S at 830 am COVID test ------01-09-2020 at 930 am Arrive at -------630 am 01-10-2020 No food after midnight, clear liquids from midnight until 530 am then npo Medications to take morning of surgery -----alprazolam prn, amlodipine, levothyroxine Diabetic medication -----n/a Patient Special Instructions -----none Pre-Op special Istructions -----none Patient verbalized understanding of instructions that were given at this phone interview. Patient denies shortness of breath, chest pain, fever, cough a this phone interview.

## 2020-01-04 ENCOUNTER — Ambulatory Visit: Payer: Medicare PPO | Admitting: Obstetrics & Gynecology

## 2020-01-09 ENCOUNTER — Encounter (HOSPITAL_COMMUNITY)
Admission: RE | Admit: 2020-01-09 | Discharge: 2020-01-09 | Disposition: A | Discharge status: Home / Self Care | Payer: Medicare PPO | Source: Ambulatory Visit | Attending: Gynecologic Oncology | Admitting: Gynecologic Oncology

## 2020-01-09 ENCOUNTER — Telehealth: Payer: Self-pay | Admitting: Oncology

## 2020-01-09 ENCOUNTER — Telehealth: Payer: Self-pay | Admitting: *Deleted

## 2020-01-09 ENCOUNTER — Other Ambulatory Visit (HOSPITAL_COMMUNITY)
Admission: RE | Admit: 2020-01-09 | Discharge: 2020-01-09 | Disposition: A | Discharge status: Home / Self Care | Payer: Medicare PPO | Source: Ambulatory Visit | Attending: Gynecologic Oncology | Admitting: Gynecologic Oncology

## 2020-01-09 ENCOUNTER — Other Ambulatory Visit: Payer: Self-pay

## 2020-01-09 ENCOUNTER — Telehealth: Payer: Self-pay | Admitting: Gynecologic Oncology

## 2020-01-09 DIAGNOSIS — Z01812 Encounter for preprocedural laboratory examination: Secondary | ICD-10-CM | POA: Diagnosis not present

## 2020-01-09 DIAGNOSIS — Z20822 Contact with and (suspected) exposure to covid-19: Secondary | ICD-10-CM | POA: Diagnosis not present

## 2020-01-09 LAB — CBC
HCT: 44.7 % (ref 36.0–46.0)
Hemoglobin: 15.2 g/dL — ABNORMAL HIGH (ref 12.0–15.0)
MCH: 30.9 pg (ref 26.0–34.0)
MCHC: 34 g/dL (ref 30.0–36.0)
MCV: 90.9 fL (ref 80.0–100.0)
Platelets: 328 10*3/uL (ref 150–400)
RBC: 4.92 MIL/uL (ref 3.87–5.11)
RDW: 13.7 % (ref 11.5–15.5)
WBC: 6.6 10*3/uL (ref 4.0–10.5)
nRBC: 0 % (ref 0.0–0.2)

## 2020-01-09 LAB — BASIC METABOLIC PANEL
Anion gap: 13 (ref 5–15)
BUN: 9 mg/dL (ref 8–23)
CO2: 25 mmol/L (ref 22–32)
Calcium: 9.4 mg/dL (ref 8.9–10.3)
Chloride: 101 mmol/L (ref 98–111)
Creatinine, Ser: 0.68 mg/dL (ref 0.44–1.00)
GFR calc Af Amer: >60 mL/min (ref >60)
GFR calc non Af Amer: >60 mL/min (ref >60)
Glucose, Bld: 111 mg/dL — ABNORMAL HIGH (ref 70–99)
Potassium: 3.2 mmol/L — ABNORMAL LOW (ref 3.5–5.1)
Sodium: 139 mmol/L (ref 135–145)

## 2020-01-09 LAB — SARS CORONAVIRUS 2 (TAT 6-24 HRS): SARS Coronavirus 2: NEGATIVE

## 2020-01-09 NOTE — Telephone Encounter (Signed)
Returned call to patient.  All questions answered.  Discussed procedure for tomorrow.  Will reach out to IT about patient not being able to message Dr. Berline Betty Sanchez or myself.

## 2020-01-09 NOTE — Telephone Encounter (Signed)
Patient called and asked to speak with Melissa APP; explained that she was with a patient and took a message. Patient wanted to ask Melissa APP about my chart messages and opinions regarding travel.

## 2020-01-09 NOTE — Anesthesia Preprocedure Evaluation (Addendum)
Anesthesia Evaluation  Patient identified by MRN, date of birth, ID band Patient awake    Reviewed: Allergy & Precautions, NPO status , Patient's Chart, lab work & pertinent test results  History of Anesthesia Complications Negative for: history of anesthetic complications  Airway Mallampati: III  TM Distance: >3 FB Neck ROM: Full    Dental  (+) Dental Advisory Given, Teeth Intact   Pulmonary neg pulmonary ROS,    Pulmonary exam normal        Cardiovascular hypertension, Pt. on medications Normal cardiovascular exam     Neuro/Psych PSYCHIATRIC DISORDERS Anxiety negative neurological ROS     GI/Hepatic negative GI ROS, Neg liver ROS,   Endo/Other  Hypothyroidism   Renal/GU negative Renal ROS     Musculoskeletal negative musculoskeletal ROS (+)   Abdominal   Peds  Hematology negative hematology ROS (+)   Anesthesia Other Findings Covid test negative   Reproductive/Obstetrics                           Anesthesia Physical Anesthesia Plan  ASA: II  Anesthesia Plan: General   Post-op Pain Management:    Induction: Intravenous  PONV Risk Score and Plan: 3 and Treatment may vary due to age or medical condition, Ondansetron and Dexamethasone  Airway Management Planned: LMA  Additional Equipment: None  Intra-op Plan:   Post-operative Plan: Extubation in OR  Informed Consent: I have reviewed the patients History and Physical, chart, labs and discussed the procedure including the risks, benefits and alternatives for the proposed anesthesia with the patient or authorized representative who has indicated his/her understanding and acceptance.     Dental advisory given  Plan Discussed with: CRNA and Anesthesiologist  Anesthesia Plan Comments:        Anesthesia Quick Evaluation

## 2020-01-09 NOTE — Telephone Encounter (Signed)
Called Betty Sanchez and advised her of low potassium of 3.2.  Advised her to eat potassium rich foods like bananas and potatoes.  Also let her know we will forward her lab results to Clarene Reamer, NP.  She verbalized understanding and agreement.

## 2020-01-10 ENCOUNTER — Encounter (HOSPITAL_BASED_OUTPATIENT_CLINIC_OR_DEPARTMENT_OTHER)
Admission: RE | Disposition: A | Discharge status: Home / Self Care | Payer: Self-pay | Source: Home / Self Care | Attending: Gynecologic Oncology

## 2020-01-10 ENCOUNTER — Encounter: Payer: Self-pay | Admitting: Gynecologic Oncology

## 2020-01-10 ENCOUNTER — Other Ambulatory Visit: Payer: Self-pay

## 2020-01-10 ENCOUNTER — Ambulatory Visit (HOSPITAL_BASED_OUTPATIENT_CLINIC_OR_DEPARTMENT_OTHER): Payer: Medicare PPO | Admitting: Anesthesiology

## 2020-01-10 ENCOUNTER — Encounter (HOSPITAL_BASED_OUTPATIENT_CLINIC_OR_DEPARTMENT_OTHER): Payer: Self-pay | Admitting: Gynecologic Oncology

## 2020-01-10 ENCOUNTER — Ambulatory Visit (HOSPITAL_BASED_OUTPATIENT_CLINIC_OR_DEPARTMENT_OTHER)
Admission: RE | Admit: 2020-01-10 | Discharge: 2020-01-10 | Disposition: A | Discharge status: Home / Self Care | Payer: Medicare PPO | Attending: Gynecologic Oncology | Admitting: Gynecologic Oncology

## 2020-01-10 DIAGNOSIS — R1900 Intra-abdominal and pelvic swelling, mass and lump, unspecified site: Secondary | ICD-10-CM | POA: Diagnosis not present

## 2020-01-10 DIAGNOSIS — N9489 Other specified conditions associated with female genital organs and menstrual cycle: Secondary | ICD-10-CM

## 2020-01-10 DIAGNOSIS — Z79890 Hormone replacement therapy (postmenopausal): Secondary | ICD-10-CM | POA: Diagnosis not present

## 2020-01-10 DIAGNOSIS — N950 Postmenopausal bleeding: Secondary | ICD-10-CM | POA: Diagnosis not present

## 2020-01-10 DIAGNOSIS — N858 Other specified noninflammatory disorders of uterus: Secondary | ICD-10-CM | POA: Diagnosis not present

## 2020-01-10 DIAGNOSIS — F419 Anxiety disorder, unspecified: Secondary | ICD-10-CM | POA: Diagnosis not present

## 2020-01-10 DIAGNOSIS — E039 Hypothyroidism, unspecified: Secondary | ICD-10-CM | POA: Insufficient documentation

## 2020-01-10 DIAGNOSIS — I10 Essential (primary) hypertension: Secondary | ICD-10-CM | POA: Diagnosis not present

## 2020-01-10 DIAGNOSIS — N840 Polyp of corpus uteri: Secondary | ICD-10-CM | POA: Insufficient documentation

## 2020-01-10 DIAGNOSIS — Z79899 Other long term (current) drug therapy: Secondary | ICD-10-CM | POA: Diagnosis not present

## 2020-01-10 HISTORY — DX: Hypothyroidism, unspecified: E03.9

## 2020-01-10 HISTORY — DX: Abnormal uterine and vaginal bleeding, unspecified: N93.9

## 2020-01-10 HISTORY — PX: DILATATION & CURETTAGE/HYSTEROSCOPY WITH MYOSURE: SHX6511

## 2020-01-10 HISTORY — DX: Anxiety disorder, unspecified: F41.9

## 2020-01-10 LAB — TYPE AND SCREEN
ABO/RH(D): A POS
Antibody Screen: NEGATIVE

## 2020-01-10 SURGERY — DILATATION & CURETTAGE/HYSTEROSCOPY WITH MYOSURE
Anesthesia: General

## 2020-01-10 MED ORDER — ONDANSETRON HCL 4 MG PO TABS: 4.0000 mg | ORAL_TABLET | Freq: Four times a day (QID) | ORAL | Status: DC | PRN

## 2020-01-10 MED ORDER — KETOROLAC TROMETHAMINE 30 MG/ML IJ SOLN
INTRAMUSCULAR | Status: DC | PRN
  Administered 2020-01-10: 30 mg via INTRAVENOUS

## 2020-01-10 MED ORDER — LIDOCAINE HCL (CARDIAC) PF 100 MG/5ML IV SOSY
PREFILLED_SYRINGE | INTRAVENOUS | Status: DC | PRN
  Administered 2020-01-10: 60 mg via INTRAVENOUS

## 2020-01-10 MED ORDER — FENTANYL CITRATE (PF) 100 MCG/2ML IJ SOLN
INTRAMUSCULAR | Status: AC
  Filled 2020-01-10: qty 2

## 2020-01-10 MED ORDER — CHLOROPROCAINE HCL 1 % IJ SOLN
INTRAMUSCULAR | Status: DC | PRN
  Administered 2020-01-10: 10 mL

## 2020-01-10 MED ORDER — GABAPENTIN 100 MG PO CAPS
100.0000 mg | ORAL_CAPSULE | ORAL | Status: AC
  Administered 2020-01-10: 100 mg via ORAL

## 2020-01-10 MED ORDER — LACTATED RINGERS IV SOLN: INTRAVENOUS | Status: DC

## 2020-01-10 MED ORDER — DEXAMETHASONE SODIUM PHOSPHATE 10 MG/ML IJ SOLN: 4.0000 mg | INTRAMUSCULAR | Status: DC

## 2020-01-10 MED ORDER — OXYCODONE HCL 5 MG PO TABS: 5.0000 mg | ORAL_TABLET | Freq: Once | ORAL | Status: DC | PRN

## 2020-01-10 MED ORDER — TRAMADOL HCL 50 MG PO TABS: 50.0000 mg | ORAL_TABLET | Freq: Four times a day (QID) | ORAL | Status: DC | PRN

## 2020-01-10 MED ORDER — DEXAMETHASONE SODIUM PHOSPHATE 10 MG/ML IJ SOLN
INTRAMUSCULAR | Status: DC | PRN
  Administered 2020-01-10: 10 mg via INTRAVENOUS

## 2020-01-10 MED ORDER — PROPOFOL 10 MG/ML IV BOLUS
INTRAVENOUS | Status: DC | PRN
  Administered 2020-01-10: 150 mg via INTRAVENOUS

## 2020-01-10 MED ORDER — PROPOFOL 10 MG/ML IV BOLUS
INTRAVENOUS | Status: AC
  Filled 2020-01-10: qty 40

## 2020-01-10 MED ORDER — ACETAMINOPHEN 500 MG PO TABS
ORAL_TABLET | ORAL | Status: AC
  Filled 2020-01-10: qty 2

## 2020-01-10 MED ORDER — FENTANYL CITRATE (PF) 100 MCG/2ML IJ SOLN
INTRAMUSCULAR | Status: DC | PRN
  Administered 2020-01-10 (×2): 25 ug via INTRAVENOUS
  Administered 2020-01-10: 50 ug via INTRAVENOUS

## 2020-01-10 MED ORDER — ONDANSETRON HCL 4 MG/2ML IJ SOLN
INTRAMUSCULAR | Status: AC
  Filled 2020-01-10: qty 2

## 2020-01-10 MED ORDER — OXYCODONE HCL 5 MG PO TABS: 5.0000 mg | ORAL_TABLET | ORAL | Status: DC | PRN

## 2020-01-10 MED ORDER — LIDOCAINE 2% (20 MG/ML) 5 ML SYRINGE
INTRAMUSCULAR | Status: AC
  Filled 2020-01-10: qty 5

## 2020-01-10 MED ORDER — SODIUM CHLORIDE 0.9 % IR SOLN
Status: DC | PRN
  Administered 2020-01-10: 3000 mL

## 2020-01-10 MED ORDER — DEXAMETHASONE SODIUM PHOSPHATE 10 MG/ML IJ SOLN
INTRAMUSCULAR | Status: AC
  Filled 2020-01-10: qty 1

## 2020-01-10 MED ORDER — ACETAMINOPHEN 500 MG PO TABS
1000.0000 mg | ORAL_TABLET | ORAL | Status: AC
  Administered 2020-01-10: 1000 mg via ORAL

## 2020-01-10 MED ORDER — GABAPENTIN 100 MG PO CAPS
ORAL_CAPSULE | ORAL | Status: AC
  Filled 2020-01-10: qty 1

## 2020-01-10 MED ORDER — ONDANSETRON HCL 4 MG/2ML IJ SOLN: 4.0000 mg | Freq: Four times a day (QID) | INTRAMUSCULAR | Status: DC | PRN

## 2020-01-10 MED ORDER — ONDANSETRON HCL 4 MG/2ML IJ SOLN
INTRAMUSCULAR | Status: DC | PRN
  Administered 2020-01-10: 4 mg via INTRAVENOUS

## 2020-01-10 MED ORDER — OXYCODONE HCL 5 MG/5ML PO SOLN: 5.0000 mg | Freq: Once | ORAL | Status: DC | PRN

## 2020-01-10 MED ORDER — ONDANSETRON HCL 4 MG/2ML IJ SOLN: 4.0000 mg | Freq: Once | INTRAMUSCULAR | Status: DC | PRN

## 2020-01-10 MED ORDER — GABAPENTIN 300 MG PO CAPS
ORAL_CAPSULE | ORAL | Status: AC
  Filled 2020-01-10: qty 1

## 2020-01-10 MED ORDER — FENTANYL CITRATE (PF) 100 MCG/2ML IJ SOLN: 25.0000 ug | INTRAMUSCULAR | Status: DC | PRN

## 2020-01-10 SURGICAL SUPPLY — 24 items
BIPOLAR CUTTING LOOP 21FR (ELECTRODE)
CANISTER SUCT 3000ML PPV (MISCELLANEOUS) ×3 IMPLANT
CATH ROBINSON RED A/P 16FR (CATHETERS) IMPLANT
COVER WAND RF STERILE (DRAPES) ×3 IMPLANT
DEVICE MYOSURE LITE (MISCELLANEOUS) IMPLANT
DEVICE MYOSURE REACH (MISCELLANEOUS) ×3 IMPLANT
DILATOR CANAL MILEX (MISCELLANEOUS) IMPLANT
GAUZE 4X4 16PLY RFD (DISPOSABLE) ×3 IMPLANT
GLOVE BIO SURGEON STRL SZ 6 (GLOVE) ×3 IMPLANT
GLOVE BIO SURGEON STRL SZ 6.5 (GLOVE) IMPLANT
GLOVE BIO SURGEONS STRL SZ 6.5 (GLOVE)
GOWN STRL REUS W/TWL LRG LVL3 (GOWN DISPOSABLE) ×3 IMPLANT
IV NS IRRIG 3000ML ARTHROMATIC (IV SOLUTION) ×3 IMPLANT
KIT PROCEDURE FLUENT (KITS) ×3 IMPLANT
KIT TURNOVER CYSTO (KITS) ×3 IMPLANT
LOOP CUTTING BIPOLAR 21FR (ELECTRODE) IMPLANT
MYOSURE XL FIBROID REM (MISCELLANEOUS)
PACK VAGINAL MINOR WOMEN LF (CUSTOM PROCEDURE TRAY) ×3 IMPLANT
PAD OB MATERNITY 4.3X12.25 (PERSONAL CARE ITEMS) ×3 IMPLANT
PAD PREP 24X48 CUFFED NSTRL (MISCELLANEOUS) ×3 IMPLANT
SEAL ROD LENS SCOPE MYOSURE (ABLATOR) IMPLANT
SYSTEM TISS REMOVAL MYSR XL RM (MISCELLANEOUS) IMPLANT
TOWEL OR 17X26 10 PK STRL BLUE (TOWEL DISPOSABLE) ×3 IMPLANT
WATER STERILE IRR 500ML POUR (IV SOLUTION) ×3 IMPLANT

## 2020-01-10 NOTE — Interval H&P Note (Signed)
History and Physical Interval Note:  01/10/2020 7:42 AM  Betty Sanchez  has presented today for surgery, with the diagnosis of ABNORMAL UTERINE BLEEDING, ENDOMETRIAL MASS.  The various methods of treatment have been discussed with the patient and family. After consideration of risks, benefits and other options for treatment, the patient has consented to  Procedure(s): [place] (N/A) as a surgical intervention.  The patient's history has been reviewed, patient examined, no change in status, stable for surgery.  I have reviewed the patient's chart and labs.  Questions were answered to the patient's satisfaction.     Lafonda Mosses

## 2020-01-10 NOTE — Anesthesia Procedure Notes (Signed)
Procedure Name: LMA Insertion Date/Time: 01/10/2020 8:27 AM Performed by: Justice Rocher, CRNA Pre-anesthesia Checklist: Patient identified, Emergency Drugs available, Suction available, Patient being monitored and Timeout performed Patient Re-evaluated:Patient Re-evaluated prior to induction Oxygen Delivery Method: Circle system utilized Preoxygenation: Pre-oxygenation with 100% oxygen Induction Type: IV induction Ventilation: Mask ventilation without difficulty LMA: LMA inserted LMA Size: 4.0 Number of attempts: 1 Airway Equipment and Method: Bite block Placement Confirmation: positive ETCO2,  CO2 detector and breath sounds checked- equal and bilateral Tube secured with: Tape Dental Injury: Teeth and Oropharynx as per pre-operative assessment  Comments: Tooth loose preop

## 2020-01-10 NOTE — Transfer of Care (Signed)
Immediate Anesthesia Transfer of Care Note  Patient: Betty Sanchez  Procedure(s) Performed: Procedure(s) (LRB): MYOSURE WITH ENDOMETRIAL SAMPLING (N/A)  Patient Location: PACU  Anesthesia Type: General  Level of Consciousness: awake, sedated, patient cooperative and responds to stimulation  Airway & Oxygen Therapy: Patient Spontanous Breathing and Patient connected to [place] 02 and soft FM  Post-op Assessment: Report given to PACU RN, Post -op Vital signs reviewed and stable and Patient moving all extremities  Post vital signs: Reviewed and stable  Complications: No apparent anesthesia complications

## 2020-01-10 NOTE — Op Note (Signed)
PATIENT: Betty Sanchez DATE: 01/10/20   Preop Diagnosis: Endometrial mass  Postoperative Diagnosis: same  Surgery: Hysteroscopic resection of endometrial mass with Myosure  Surgeons:  Jeral Pinch, MD Assistant: Joylene John, NP  Anesthesia: General   Estimated blood loss: 25 ml  IVF:  See I&O flowsheet   Urine output: no bladder instrumentation during procedure   Complications: None apparent  Pathology: endometrial mass  Operative findings: On EUA, small mobile uterus, no adnexal masses. On hysteroscopy, 1-2cm lobulated mass arising from the fundal left uterine sidewall and extending half way into the cavity, obscuring the left tubal ostia until portion of the mass resected. Mass had calcified appearing areas as well as enlarged and atypical blood vessels. Remainder of the endometrium was atrophic.  Fluid balance: -130cc  Procedure: The patient was identified in the preoperative holding area. Informed consent was signed on the chart. Patient was seen history was reviewed and exam was performed.   The patient was then taken to the operating room and placed in the supine position with SCD hose on. General anesthesia was then induced without difficulty. She was then placed in the dorsolithotomy position. The perineum was prepped with Betadine. The vagina was prepped with Betadine. The patient was then draped after the prep was dried.   Timeout was performed the patient, procedure, antibiotic, allergy, and length of procedure.   The speculum was placed in the posterior vagina. The single tooth tenaculum was placed on the anterior lip of the cervix. The uterine sound was placed in the cervix and advanced to the fundus at 8cm. The cervix was successively dilated using pratts dilators to 35F.  The myosure hysteroscope was then inserted into the uterus and pictures were taken. The closed suction cutting device was then inserted and the majority of the mass resected under  direct visualization. Pictures were taken after mass excision.   The specimen was collected in the specimen bag to be sent to pathology.  The tenaculum was removed and hemostasis was observed. Upon removal of the speculum, bleeding was noted from the hymen on the anterior right. Silver nitrate and monopolar electrocautery were used to achieve hemostasis.   All instrument, suture, laparotomy, Ray-Tec, and needle counts were correct x2.  The patient tolerated the procedure well and was taken recovery room in stable condition.     Jeral Pinch MD Gynecologic Oncology

## 2020-01-10 NOTE — Anesthesia Postprocedure Evaluation (Signed)
Anesthesia Post Note  Patient: Betty Sanchez  Procedure(s) Performed: MYOSURE WITH ENDOMETRIAL SAMPLING (N/A )     Patient location during evaluation: PACU Anesthesia Type: General Level of consciousness: awake and alert Pain management: pain level controlled Vital Signs Assessment: post-procedure vital signs reviewed and stable Respiratory status: spontaneous breathing, nonlabored ventilation and respiratory function stable Cardiovascular status: blood pressure returned to baseline and stable Postop Assessment: no apparent nausea or vomiting Anesthetic complications: no   No complications documented.  Last Vitals:  Vitals:   01/10/20 0921 01/10/20 0930  BP: 115/80 (!) 115/57  Pulse: 80 72  Resp: 10 16  Temp: 36.4 C   SpO2: 100% 99%    Last Pain:  Vitals:   01/10/20 0945  TempSrc:   PainSc: 0-No pain                 Audry Pili

## 2020-01-10 NOTE — Discharge Instructions (Addendum)
01/10/2020  Return to work: 1 week if applicable  Activity: 1. Be up and out of the bed during the day.  Take a nap if needed.  You may walk up steps but be careful and use the hand rail.  Stair climbing will tire you more than you think, you may need to stop part way and rest.   2. No lifting or straining for 4 weeks.  3. No driving for 48 hours after the procedure.  Do not drive if you are taking narcotic pain medicine.  4. Shower daily.  Use soap and water on your incision and pat dry; don't rub.  No tub baths until cleared by your surgeon.   Post Anesthesia Home Care Instructions  Activity: Get plenty of rest for the remainder of the day. A responsible adult should stay with you for 24 hours following the procedure.  For the next 24 hours, DO NOT: -Drive a car -Paediatric nurse -Drink alcoholic beverages -Take any medication unless instructed by your physician -Make any legal decisions or sign important papers.  Meals: Start with liquid foods such as gelatin or soup. Progress to regular foods as tolerated. Avoid greasy, spicy, heavy foods. If nausea and/or vomiting occur, drink only clear liquids until the nausea and/or vomiting subsides. Call your physician if vomiting continues.  Special Instructions/Symptoms: Your throat may feel dry or sore from the anesthesia or the breathing tube placed in your throat during surgery. If this causes discomfort, gargle with warm salt water. The discomfort should disappear within 24 hours.  If you had a scopolamine patch placed behind your ear for the management of post- operative nausea and/or vomiting:  1. The medication in the patch is effective for 72 hours, after which it should be removed.  Wrap patch in a tissue and discard in the trash. Wash hands thoroughly with soap and water. 2. You may remove the patch earlier than 72 hours if you experience unpleasant side effects which may include dry mouth, dizziness or visual disturbances. 3.  Avoid touching the patch. Wash your hands with soap and water after contact with the patch.     5. No sexual activity and nothing in the vagina for 4 weeks.  6. You may experience vaginal spotting after the procedure.  The spotting is normal but if you experience heavy bleeding, call our office.  8. Take Tylenol or ibuprofen for pain next dose of ibuprofen  after 3pm today .  Monitor your Tylenol intake to a max of 4,000 mg.  Diet: 1. Low sodium Heart Healthy Diet is recommended.  2. It is safe to use a laxative, such as Miralax or Colace, if you have difficulty moving your bowels.   Reasons to call the Doctor:  Fever - Oral temperature greater than 100.4 degrees Fahrenheit  Foul-smelling vaginal discharge  Difficulty urinating  Nausea and vomiting  Increased pain at the site of the incision that is unrelieved with pain medicine.  Difficulty breathing with or without chest pain  New calf pain especially if only on one side  Sudden, continuing increased vaginal bleeding with or without clots.   Contacts: For questions or concerns you should contact:  Dr. Jeral Pinch at [phone]  Joylene John, NP at [phone]  After Hours: call [phone] and have the GYN Oncologist paged/contacted

## 2020-01-11 ENCOUNTER — Telehealth: Payer: Self-pay

## 2020-01-11 NOTE — Telephone Encounter (Signed)
Ms Rials states that she is eating, drinking, and urinating well. She is not passing gas nor has had a BM. She will begin a stool softener today bid. She is having vaginal bleeding. It has decreased from yesterday. She is using 1 pad a day. She is to call if the bleeding becomes heavy like a period. She knows the office number [phone] if she has any questions or concerns and her post op appointments as well.

## 2020-01-12 ENCOUNTER — Telehealth: Payer: Self-pay | Admitting: *Deleted

## 2020-01-12 ENCOUNTER — Telehealth: Payer: Self-pay | Admitting: Gynecologic Oncology

## 2020-01-12 ENCOUNTER — Encounter (HOSPITAL_BASED_OUTPATIENT_CLINIC_OR_DEPARTMENT_OTHER): Payer: Self-pay | Admitting: Gynecologic Oncology

## 2020-01-12 LAB — SURGICAL PATHOLOGY

## 2020-01-12 NOTE — Progress Notes (Signed)
Called the patient and reviewed pathology from recent surgery. 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 She and I had discussed this possibility at her initial visit with me. I reviewed again the recommendation for definitive surgical management to include total hysterectomy, BSO, and lymph node assessment. We also reviewed other options for treatment including oral and in trauterine progesterone therapy. The patient was accepting of this news and would like to move forward with scheduling surgery. I will have my nurse practitioner, Lenna Sciara, reach out to the patient to pick a surgery date. All questions answered. Valarie Cones MD

## 2020-01-12 NOTE — Telephone Encounter (Signed)
Patient called and stated "I'm having heavy bleeding with clots. I'm soaking through a pad every hour. The bleeding was faint yesterday, but today after I strained for a bowel movement it increased to the heavy bleeding.

## 2020-01-12 NOTE — Telephone Encounter (Signed)
Returned call to patient following up on concerns of vaginal bleeding.  All questions answered.  Advised she would be contacted with potential surgery dates.

## 2020-01-12 NOTE — Telephone Encounter (Signed)
Called patient and informed her that July 21 was an available surgical date. She is agreeable with proceeding with surgery on this date. Pre-op appt in the office arranged for July 15 with Dr. Berline Lopes and myself.

## 2020-01-14 ENCOUNTER — Encounter: Payer: Self-pay | Admitting: Family Medicine

## 2020-01-14 ENCOUNTER — Encounter: Payer: Self-pay | Admitting: Gynecologic Oncology

## 2020-01-15 ENCOUNTER — Telehealth: Payer: Self-pay | Admitting: Gynecologic Oncology

## 2020-01-15 NOTE — Telephone Encounter (Signed)
Called the patient and had a long conversation about the concerns she raised in a mychart message over the weekend. 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 All of the patient's questions about surgery were answered.  Jeral Pinch MD Gynecologic Oncology

## 2020-01-16 ENCOUNTER — Other Ambulatory Visit: Payer: Self-pay | Admitting: Gynecologic Oncology

## 2020-01-16 ENCOUNTER — Encounter: Payer: Self-pay | Admitting: Gynecologic Oncology

## 2020-01-16 DIAGNOSIS — N8502 Endometrial intraepithelial neoplasia [EIN]: Secondary | ICD-10-CM

## 2020-01-16 MED ORDER — SENNOSIDES-DOCUSATE SODIUM 8.6-50 MG PO TABS: 2.0000 | ORAL_TABLET | Freq: Every day | ORAL | 0 refills | Status: DC

## 2020-01-16 MED ORDER — TRAMADOL HCL 50 MG PO TABS: 50.0000 mg | ORAL_TABLET | Freq: Four times a day (QID) | ORAL | 0 refills | Status: DC | PRN

## 2020-01-16 NOTE — Progress Notes (Signed)
Called and spoke with patient and advised her that I would be sending her preop instructions in a mychart message for her review. Advised I would also be sending in her preop medications today (tramadol, sennakot-S) to be picked up prior to surgery. Advised to review the paperwork and give me call with any questions or concerns.

## 2020-01-17 ENCOUNTER — Encounter (HOSPITAL_BASED_OUTPATIENT_CLINIC_OR_DEPARTMENT_OTHER): Payer: Self-pay | Admitting: Gynecologic Oncology

## 2020-01-17 ENCOUNTER — Other Ambulatory Visit: Payer: Self-pay

## 2020-01-17 NOTE — Progress Notes (Signed)
Betty Sanchez  01/17/2020      Your procedure is scheduled on7/21/2021   Report to [place].M.  Call this number if you have problems the morning of surgery:[phone]  OUR ADDRESS IS [address], WE ARE LOCATED [facility] WITH [facility].   Remember:  Do not eat food or drink liquids after midnight. Eat a light diet the day before surgery. Avoid gas producing foods.  No solid foods after midnite.  May have clear liquids from 12 midnite until 1000am morning of surgery then nothing by mouth   Take these medicines the morning of surgery with A SIP OF WATER Xanax if neededd, Amlodipine, Synthroid   Do not wear jewelry, make-up or nail polish.  Do not wear lotions, powders, or perfumes, or deoderant.  Do not shave 48 hours prior to surgery.  .  Do not bring valuables to the hospital.  [facility] is not responsible for any belongings or valuables.  Contacts, dentures or bridgework may not be worn into surgery.  Leave your suitcase in the car.  After surgery it may be brought to your room.  For patients admitted to the hospital, discharge time will be determined by your treatment team.  Patients discharged the day of surgery will not be allowed to drive home.     Please read over the following fact sheets that you were given:          CLEAR LIQUID DIET   Foods Allowed                                                    Coffee and tea, regular and decaf    - no mild or creamer                                                        Fruit ices (not with fruit pulp)                                    Iced Popsicles                                   Carbonated beverages, regular and diet                                    Cranberry, grape and apple juices Sports drinks like Gatorade Lightly seasoned clear broth or consume(fat free) Sugar, honey syrup     [place] - Preparing for Surgery Before surgery, you can  play an important role.  Because skin is not sterile, your skin needs to be as free of germs as possible.  You can reduce the number of germs on your skin by washing with CHG (chlorahexidine gluconate) soap before surgery.  CHG is an antiseptic cleaner which kills germs and bonds with the skin to continue killing germs even after  washing. Please DO NOT use if you have an allergy to CHG or antibacterial soaps.  If your skin becomes reddened/irritated stop using the CHG and inform your nurse when you arrive at Short Stay. Do not shave (including legs and underarms) for at least 48 hours prior to the first CHG shower.  You may shave your face/neck. Please follow these instructions carefully:  1.  Shower with CHG Soap the night before surgery and the  morning of Surgery.  2.  If you choose to wash your hair, wash your hair first as usual with your  normal  shampoo.  3.  After you shampoo, rinse your hair and body thoroughly to remove the  shampoo.                           4.  Use CHG as you would any other liquid soap.  You can apply chg directly  to the skin and wash                       Gently with a scrungie or clean washcloth.  5.  Apply the CHG Soap to your body ONLY FROM THE NECK DOWN.   Do not use on face/ open                           Wound or open sores. Avoid contact with eyes, ears mouth and genitals (private parts).                       Wash face,  Genitals (private parts) with your normal soap.             6.  Wash thoroughly, paying special attention to the area where your surgery  will be performed.  7.  Thoroughly rinse your body with warm water from the neck down.  8.  DO NOT shower/wash with your normal soap after using and rinsing off  the CHG Soap.                9.  Pat yourself dry with a clean towel.            10.  Wear clean pajamas.            11.  Place clean sheets on your bed the night of your first shower and do not  sleep with pets. Day of Surgery : Do not apply any  lotions/deodorants the morning of surgery.  Please wear clean clothes to the hospital/surgery center.  FAILURE TO FOLLOW THESE INSTRUCTIONS MAY RESULT IN THE CANCELLATION OF YOUR SURGERY PATIENT SIGNATURE_________________________________  NURSE SIGNATURE__________________________________  ________________________________________________________________________   Adam Phenix  An incentive spirometer is a tool that can help keep your lungs clear and active. This tool measures how well you are filling your lungs with each breath. Taking long deep breaths may help reverse or decrease the chance of developing breathing (pulmonary) problems (especially infection) following:  A long period of time when you are unable to move or be active. BEFORE THE PROCEDURE   If the spirometer includes an indicator to show your best effort, your nurse or respiratory therapist will set it to a desired goal.  If possible, sit up straight or lean slightly forward. Try not to slouch.  Hold the incentive spirometer in an upright position. INSTRUCTIONS FOR USE  1. Sit on the edge of your  bed if possible, or sit up as far as you can in bed or on a chair. 2. Hold the incentive spirometer in an upright position. 3. Breathe out normally. 4. Place the mouthpiece in your mouth and seal your lips tightly around it. 5. Breathe in slowly and as deeply as possible, raising the piston or the ball toward the top of the column. 6. Hold your breath for 3-5 seconds or for as long as possible. Allow the piston or ball to fall to the bottom of the column. 7. Remove the mouthpiece from your mouth and breathe out normally. 8. Rest for a few seconds and repeat Steps 1 through 7 at least 10 times every 1-2 hours when you are awake. Take your time and take a few normal breaths between deep breaths. 9. The spirometer may include an indicator to show your best effort. Use the indicator as a goal to work toward during each  repetition. 10. After each set of 10 deep breaths, practice coughing to be sure your lungs are clear. If you have an incision (the cut made at the time of surgery), support your incision when coughing by placing a pillow or rolled up towels firmly against it. Once you are able to get out of bed, walk around indoors and cough well. You may stop using the incentive spirometer when instructed by your caregiver.  RISKS AND COMPLICATIONS  Take your time so you do not get dizzy or light-headed.  If you are in pain, you may need to take or ask for pain medication before doing incentive spirometry. It is harder to take a deep breath if you are having pain. AFTER USE  Rest and breathe slowly and easily.  It can be helpful to keep track of a log of your progress. Your caregiver can provide you with a simple table to help with this. If you are using the spirometer at home, follow these instructions: [place] IF:   You are having difficultly using the spirometer.  You have trouble using the spirometer as often as instructed.  Your pain medication is not giving enough relief while using the spirometer.  You develop fever of 100.5 F (38.1 C) or higher. SEEK IMMEDIATE MEDICAL CARE IF:   You cough up bloody sputum that had not been present before.  You develop fever of 102 F (38.9 C) or greater.  You develop worsening pain at or near the incision site. MAKE SURE YOU:   Understand these instructions.  Will watch your condition.  Will get help right away if you are not doing well or get worse. Document Released: 11/02/2006 Document Revised: 09/14/2011 Document Reviewed: 01/03/2007 [facility] Patient Information 2014 [employer], [place].   ________________________________________________________________________   ________________________________________________________________________   ________________________________________________________________________

## 2020-01-18 ENCOUNTER — Ambulatory Visit: Payer: PRIVATE HEALTH INSURANCE | Admitting: Gynecologic Oncology

## 2020-01-18 ENCOUNTER — Encounter (HOSPITAL_COMMUNITY)
Admission: RE | Admit: 2020-01-18 | Discharge: 2020-01-18 | Disposition: A | Discharge status: Home / Self Care | Payer: Medicare PPO | Source: Ambulatory Visit | Attending: Gynecologic Oncology | Admitting: Gynecologic Oncology

## 2020-01-18 DIAGNOSIS — Z01812 Encounter for preprocedural laboratory examination: Secondary | ICD-10-CM | POA: Diagnosis not present

## 2020-01-18 LAB — URINALYSIS, COMPLETE (UACMP) WITH MICROSCOPIC
Bilirubin Urine: NEGATIVE
Glucose, UA: NEGATIVE mg/dL
Ketones, ur: NEGATIVE mg/dL
Nitrite: NEGATIVE
Protein, ur: NEGATIVE mg/dL
Specific Gravity, Urine: 1.008 (ref 1.005–1.030)
pH: 5 (ref 5.0–8.0)

## 2020-01-18 LAB — COMPREHENSIVE METABOLIC PANEL
ALT: 17 U/L (ref 0–44)
AST: 23 U/L (ref 15–41)
Albumin: 4.2 g/dL (ref 3.5–5.0)
Alkaline Phosphatase: 54 U/L (ref 38–126)
Anion gap: 14 (ref 5–15)
BUN: 9 mg/dL (ref 8–23)
CO2: 25 mmol/L (ref 22–32)
Calcium: 9.5 mg/dL (ref 8.9–10.3)
Chloride: 100 mmol/L (ref 98–111)
Creatinine, Ser: 0.57 mg/dL (ref 0.44–1.00)
GFR calc Af Amer: >60 mL/min (ref >60)
GFR calc non Af Amer: >60 mL/min (ref >60)
Glucose, Bld: 113 mg/dL — ABNORMAL HIGH (ref 70–99)
Potassium: 3.8 mmol/L (ref 3.5–5.1)
Sodium: 139 mmol/L (ref 135–145)
Total Bilirubin: 0.8 mg/dL (ref 0.3–1.2)
Total Protein: 7.7 g/dL (ref 6.5–8.1)

## 2020-01-18 LAB — CBC
HCT: 46 % (ref 36.0–46.0)
Hemoglobin: 15.4 g/dL — ABNORMAL HIGH (ref 12.0–15.0)
MCH: 31 pg (ref 26.0–34.0)
MCHC: 33.5 g/dL (ref 30.0–36.0)
MCV: 92.6 fL (ref 80.0–100.0)
Platelets: 301 10*3/uL (ref 150–400)
RBC: 4.97 MIL/uL (ref 3.87–5.11)
RDW: 13.8 % (ref 11.5–15.5)
WBC: 8.4 10*3/uL (ref 4.0–10.5)
nRBC: 0 % (ref 0.0–0.2)

## 2020-01-18 NOTE — Progress Notes (Signed)
U/A results faxed via epic to DR Jeral Pinch and Melissa Cross,NP.

## 2020-01-20 ENCOUNTER — Other Ambulatory Visit (HOSPITAL_COMMUNITY): Payer: PRIVATE HEALTH INSURANCE

## 2020-01-21 ENCOUNTER — Encounter: Payer: Self-pay | Admitting: Family Medicine

## 2020-01-22 ENCOUNTER — Other Ambulatory Visit (HOSPITAL_COMMUNITY)
Admission: RE | Admit: 2020-01-22 | Discharge: 2020-01-22 | Disposition: A | Discharge status: Home / Self Care | Payer: PRIVATE HEALTH INSURANCE | Source: Ambulatory Visit | Attending: Gynecologic Oncology | Admitting: Gynecologic Oncology

## 2020-01-22 DIAGNOSIS — Z01812 Encounter for preprocedural laboratory examination: Secondary | ICD-10-CM | POA: Insufficient documentation

## 2020-01-22 DIAGNOSIS — Z20822 Contact with and (suspected) exposure to covid-19: Secondary | ICD-10-CM | POA: Diagnosis not present

## 2020-01-22 LAB — SARS CORONAVIRUS 2 (TAT 6-24 HRS): SARS Coronavirus 2: NEGATIVE

## 2020-01-23 ENCOUNTER — Telehealth: Payer: Self-pay

## 2020-01-23 NOTE — Telephone Encounter (Signed)
Ms Altman states she understands her written  pre-op instructions for tomorrow. Pt states that she continues with some vaginal bleeding from the previous procedure on 01-10-20.  She is using 1 pad a day.  Told her that this is normal and will be taken care of with surgery tomorrow. Pt verbalized understanding.

## 2020-01-24 ENCOUNTER — Other Ambulatory Visit: Payer: Self-pay

## 2020-01-24 ENCOUNTER — Ambulatory Visit (HOSPITAL_BASED_OUTPATIENT_CLINIC_OR_DEPARTMENT_OTHER): Payer: Medicare PPO | Admitting: Anesthesiology

## 2020-01-24 ENCOUNTER — Encounter (HOSPITAL_BASED_OUTPATIENT_CLINIC_OR_DEPARTMENT_OTHER): Payer: Self-pay | Admitting: Gynecologic Oncology

## 2020-01-24 ENCOUNTER — Ambulatory Visit (HOSPITAL_BASED_OUTPATIENT_CLINIC_OR_DEPARTMENT_OTHER)
Admission: RE | Admit: 2020-01-24 | Discharge: 2020-01-24 | Disposition: A | Discharge status: Home / Self Care | Payer: Medicare PPO | Source: Other Acute Inpatient Hospital | Attending: Gynecologic Oncology | Admitting: Gynecologic Oncology

## 2020-01-24 ENCOUNTER — Encounter (HOSPITAL_BASED_OUTPATIENT_CLINIC_OR_DEPARTMENT_OTHER)
Admission: RE | Disposition: A | Discharge status: Home / Self Care | Payer: Self-pay | Source: Other Acute Inpatient Hospital | Attending: Gynecologic Oncology

## 2020-01-24 DIAGNOSIS — C541 Malignant neoplasm of endometrium: Secondary | ICD-10-CM | POA: Insufficient documentation

## 2020-01-24 DIAGNOSIS — D270 Benign neoplasm of right ovary: Secondary | ICD-10-CM | POA: Diagnosis not present

## 2020-01-24 DIAGNOSIS — Z8542 Personal history of malignant neoplasm of other parts of uterus: Secondary | ICD-10-CM

## 2020-01-24 DIAGNOSIS — D259 Leiomyoma of uterus, unspecified: Secondary | ICD-10-CM | POA: Insufficient documentation

## 2020-01-24 DIAGNOSIS — E039 Hypothyroidism, unspecified: Secondary | ICD-10-CM | POA: Insufficient documentation

## 2020-01-24 DIAGNOSIS — Z79890 Hormone replacement therapy (postmenopausal): Secondary | ICD-10-CM | POA: Insufficient documentation

## 2020-01-24 DIAGNOSIS — N8502 Endometrial intraepithelial neoplasia [EIN]: Secondary | ICD-10-CM

## 2020-01-24 DIAGNOSIS — Z79899 Other long term (current) drug therapy: Secondary | ICD-10-CM | POA: Diagnosis not present

## 2020-01-24 DIAGNOSIS — D271 Benign neoplasm of left ovary: Secondary | ICD-10-CM | POA: Insufficient documentation

## 2020-01-24 DIAGNOSIS — F419 Anxiety disorder, unspecified: Secondary | ICD-10-CM | POA: Insufficient documentation

## 2020-01-24 DIAGNOSIS — N8500 Endometrial hyperplasia, unspecified: Secondary | ICD-10-CM | POA: Insufficient documentation

## 2020-01-24 DIAGNOSIS — I10 Essential (primary) hypertension: Secondary | ICD-10-CM | POA: Insufficient documentation

## 2020-01-24 HISTORY — PX: ROBOTIC ASSISTED TOTAL HYSTERECTOMY WITH BILATERAL SALPINGO OOPHERECTOMY: SHX6086

## 2020-01-24 HISTORY — PX: SENTINEL NODE BIOPSY: SHX6608

## 2020-01-24 LAB — TYPE AND SCREEN
ABO/RH(D): A POS
Antibody Screen: NEGATIVE

## 2020-01-24 SURGERY — HYSTERECTOMY, TOTAL, ROBOT-ASSISTED, LAPAROSCOPIC, WITH BILATERAL SALPINGO-OOPHORECTOMY
Anesthesia: General | Site: Abdomen

## 2020-01-24 MED ORDER — OXYCODONE HCL 5 MG PO TABS: 5.0000 mg | ORAL_TABLET | Freq: Once | ORAL | Status: DC | PRN

## 2020-01-24 MED ORDER — BUPIVACAINE HCL 0.25 % IJ SOLN
INTRAMUSCULAR | Status: DC | PRN
  Administered 2020-01-24: 26 mL

## 2020-01-24 MED ORDER — LIDOCAINE 2% (20 MG/ML) 5 ML SYRINGE
INTRAMUSCULAR | Status: AC
  Filled 2020-01-24: qty 5

## 2020-01-24 MED ORDER — LIDOCAINE HCL (CARDIAC) PF 100 MG/5ML IV SOSY
PREFILLED_SYRINGE | INTRAVENOUS | Status: DC | PRN
  Administered 2020-01-24: 100 mg via INTRAVENOUS

## 2020-01-24 MED ORDER — FENTANYL CITRATE (PF) 100 MCG/2ML IJ SOLN
INTRAMUSCULAR | Status: DC | PRN
  Administered 2020-01-24 (×3): 50 ug via INTRAVENOUS

## 2020-01-24 MED ORDER — CEFAZOLIN SODIUM-DEXTROSE 2-4 GM/100ML-% IV SOLN
2.0000 g | INTRAVENOUS | Status: AC
  Administered 2020-01-24: 2 g via INTRAVENOUS

## 2020-01-24 MED ORDER — HEPARIN SODIUM (PORCINE) 5000 UNIT/ML IJ SOLN
5000.0000 [IU] | INTRAMUSCULAR | Status: AC
  Administered 2020-01-24: 5000 [IU] via SUBCUTANEOUS

## 2020-01-24 MED ORDER — SODIUM CHLORIDE 0.9 % IR SOLN
Status: DC | PRN
  Administered 2020-01-24: 3000 mL

## 2020-01-24 MED ORDER — STERILE WATER FOR INJECTION IJ SOLN
INTRAMUSCULAR | Status: DC | PRN
  Administered 2020-01-24: 10 mL

## 2020-01-24 MED ORDER — FENTANYL CITRATE (PF) 250 MCG/5ML IJ SOLN
INTRAMUSCULAR | Status: AC
  Filled 2020-01-24: qty 5

## 2020-01-24 MED ORDER — SUGAMMADEX SODIUM 200 MG/2ML IV SOLN
INTRAVENOUS | Status: DC | PRN
  Administered 2020-01-24: 280 mg via INTRAVENOUS

## 2020-01-24 MED ORDER — ROCURONIUM BROMIDE 100 MG/10ML IV SOLN
INTRAVENOUS | Status: DC | PRN
  Administered 2020-01-24: 70 mg via INTRAVENOUS
  Administered 2020-01-24: 20 mg via INTRAVENOUS

## 2020-01-24 MED ORDER — ROCURONIUM BROMIDE 10 MG/ML (PF) SYRINGE
PREFILLED_SYRINGE | INTRAVENOUS | Status: AC
  Filled 2020-01-24: qty 10

## 2020-01-24 MED ORDER — MIDAZOLAM HCL 2 MG/2ML IJ SOLN
INTRAMUSCULAR | Status: AC
  Filled 2020-01-24: qty 2

## 2020-01-24 MED ORDER — CEFAZOLIN SODIUM-DEXTROSE 2-4 GM/100ML-% IV SOLN
INTRAVENOUS | Status: AC
  Filled 2020-01-24: qty 100

## 2020-01-24 MED ORDER — DEXAMETHASONE SODIUM PHOSPHATE 10 MG/ML IJ SOLN: 4.0000 mg | INTRAMUSCULAR | Status: DC

## 2020-01-24 MED ORDER — FENTANYL CITRATE (PF) 100 MCG/2ML IJ SOLN: 25.0000 ug | INTRAMUSCULAR | Status: DC | PRN

## 2020-01-24 MED ORDER — PROPOFOL 10 MG/ML IV BOLUS
INTRAVENOUS | Status: AC
  Filled 2020-01-24: qty 20

## 2020-01-24 MED ORDER — OXYCODONE HCL 5 MG/5ML PO SOLN: 5.0000 mg | Freq: Once | ORAL | Status: DC | PRN

## 2020-01-24 MED ORDER — LACTATED RINGERS IV SOLN: INTRAVENOUS | Status: DC

## 2020-01-24 MED ORDER — ACETAMINOPHEN 500 MG PO TABS
1000.0000 mg | ORAL_TABLET | ORAL | Status: AC
  Administered 2020-01-24: 1000 mg via ORAL

## 2020-01-24 MED ORDER — HEPARIN SODIUM (PORCINE) 5000 UNIT/ML IJ SOLN
INTRAMUSCULAR | Status: AC
  Filled 2020-01-24: qty 1

## 2020-01-24 MED ORDER — ONDANSETRON HCL 4 MG/2ML IJ SOLN
INTRAMUSCULAR | Status: DC | PRN
  Administered 2020-01-24: 4 mg via INTRAVENOUS

## 2020-01-24 MED ORDER — MIDAZOLAM HCL 5 MG/5ML IJ SOLN
INTRAMUSCULAR | Status: DC | PRN
  Administered 2020-01-24: 1 mg via INTRAVENOUS

## 2020-01-24 MED ORDER — ONDANSETRON HCL 4 MG/2ML IJ SOLN
INTRAMUSCULAR | Status: AC
  Filled 2020-01-24: qty 2

## 2020-01-24 MED ORDER — PROPOFOL 10 MG/ML IV BOLUS
INTRAVENOUS | Status: DC | PRN
  Administered 2020-01-24: 120 mg via INTRAVENOUS

## 2020-01-24 MED ORDER — ONDANSETRON HCL 4 MG/2ML IJ SOLN: 4.0000 mg | Freq: Once | INTRAMUSCULAR | Status: DC | PRN

## 2020-01-24 MED ORDER — EPHEDRINE SULFATE-NACL 50-0.9 MG/10ML-% IV SOSY
PREFILLED_SYRINGE | INTRAVENOUS | Status: DC | PRN
  Administered 2020-01-24: 10 mg via INTRAVENOUS

## 2020-01-24 MED ORDER — ACETAMINOPHEN 500 MG PO TABS
ORAL_TABLET | ORAL | Status: AC
  Filled 2020-01-24: qty 2

## 2020-01-24 MED ORDER — DEXAMETHASONE SODIUM PHOSPHATE 10 MG/ML IJ SOLN
INTRAMUSCULAR | Status: AC
  Filled 2020-01-24: qty 1

## 2020-01-24 MED ORDER — DEXAMETHASONE SODIUM PHOSPHATE 10 MG/ML IJ SOLN
INTRAMUSCULAR | Status: DC | PRN
  Administered 2020-01-24: 10 mg via INTRAVENOUS

## 2020-01-24 SURGICAL SUPPLY — 82 items
ADH SKN CLS APL DERMABOND .7 (GAUZE/BANDAGES/DRESSINGS) ×2
AGENT HMST KT MTR STRL THRMB (HEMOSTASIS)
APL ESCP 34 STRL LF DISP (HEMOSTASIS)
APL PRP STRL LF DISP 70% ISPRP (MISCELLANEOUS) ×2
APPLICATOR SURGIFLO ENDO (HEMOSTASIS) IMPLANT
BAG LAPAROSCOPIC 12 15 PORT 16 (BASKET) ×2 IMPLANT
BAG RETRIEVAL 12/15 (BASKET) ×3
BAG RETRIEVAL 12/15MM (BASKET) ×1
BAG SPEC RTRVL LRG 6X4 10 (ENDOMECHANICALS) ×4
BLADE SURG 10 STRL SS (BLADE) IMPLANT
CHLORAPREP W/TINT 26 (MISCELLANEOUS) ×4 IMPLANT
COVER BACK TABLE 60X90IN (DRAPES) ×4 IMPLANT
COVER TIP SHEARS 8 DVNC (MISCELLANEOUS) ×2 IMPLANT
COVER TIP SHEARS 8MM DA VINCI (MISCELLANEOUS) ×2
COVER WAND RF STERILE (DRAPES) ×4 IMPLANT
DECANTER SPIKE VIAL GLASS SM (MISCELLANEOUS) ×4 IMPLANT
DERMABOND ADVANCED (GAUZE/BANDAGES/DRESSINGS) ×2
DERMABOND ADVANCED .7 DNX12 (GAUZE/BANDAGES/DRESSINGS) ×2 IMPLANT
DRAPE ARM DVNC X/XI (DISPOSABLE) ×8 IMPLANT
DRAPE COLUMN DVNC XI (DISPOSABLE) ×2 IMPLANT
DRAPE DA VINCI XI ARM (DISPOSABLE) ×8
DRAPE DA VINCI XI COLUMN (DISPOSABLE) ×2
DRAPE SHEET LG 3/4 BI-LAMINATE (DRAPES) ×4 IMPLANT
DRAPE SURG IRRIG POUCH 19X23 (DRAPES) ×4 IMPLANT
ELECT REM PT RETURN 9FT ADLT (ELECTROSURGICAL) ×4
ELECTRODE REM PT RTRN 9FT ADLT (ELECTROSURGICAL) ×2 IMPLANT
GAUZE 4X4 16PLY RFD (DISPOSABLE) ×4 IMPLANT
GAUZE SPONGE 4X4 12PLY STRL LF (GAUZE/BANDAGES/DRESSINGS) ×4 IMPLANT
GLOVE BIO SURGEON STRL SZ 6 (GLOVE) ×20 IMPLANT
GLOVE BIO SURGEON STRL SZ 6.5 (GLOVE) ×6 IMPLANT
GLOVE BIO SURGEON STRL SZ7 (GLOVE) ×8 IMPLANT
GLOVE BIO SURGEONS STRL SZ 6.5 (GLOVE) ×2
GLOVE BIOGEL PI IND STRL 6.5 (GLOVE) ×4 IMPLANT
GLOVE BIOGEL PI IND STRL 7.0 (GLOVE) ×4 IMPLANT
GLOVE BIOGEL PI IND STRL 7.5 (GLOVE) ×2 IMPLANT
GLOVE BIOGEL PI INDICATOR 6.5 (GLOVE) ×4
GLOVE BIOGEL PI INDICATOR 7.0 (GLOVE) ×4
GLOVE BIOGEL PI INDICATOR 7.5 (GLOVE) ×2
GLOVE ECLIPSE 6.5 STRL STRAW (GLOVE) ×8 IMPLANT
HOLDER FOLEY CATH W/STRAP (MISCELLANEOUS) ×4 IMPLANT
IRRIG SUCT STRYKERFLOW 2 WTIP (MISCELLANEOUS) ×4
IRRIGATION SUCT STRKRFLW 2 WTP (MISCELLANEOUS) ×2 IMPLANT
KIT PROCEDURE DA VINCI SI (MISCELLANEOUS) ×2
KIT PROCEDURE DVNC SI (MISCELLANEOUS) ×2 IMPLANT
KIT TURNOVER CYSTO (KITS) ×4 IMPLANT
LEGGING LITHOTOMY PAIR STRL (DRAPES) ×4 IMPLANT
MANIFOLD NEPTUNE II (INSTRUMENTS) ×4 IMPLANT
MANIPULATOR UTERINE 4.5 ZUMI (MISCELLANEOUS) ×4 IMPLANT
NDL SAFETY ECLIPSE 18X1.5 (NEEDLE) IMPLANT
NEEDLE HYPO 18GX1.5 SHARP (NEEDLE)
NEEDLE HYPO 22GX1.5 SAFETY (NEEDLE) ×8 IMPLANT
NEEDLE SPNL 18GX3.5 QUINCKE PK (NEEDLE) ×4 IMPLANT
OBTURATOR OPTICAL STANDARD 8MM (TROCAR) ×2
OBTURATOR OPTICAL STND 8 DVNC (TROCAR) ×2
OBTURATOR OPTICALSTD 8 DVNC (TROCAR) ×2 IMPLANT
PACK ROBOT GYN CUSTOM WL (TRAY / TRAY PROCEDURE) ×4 IMPLANT
PACK ROBOTIC GOWN (GOWN DISPOSABLE) ×4 IMPLANT
PAD POSITIONING PINK XL (MISCELLANEOUS) ×4 IMPLANT
PENCIL BUTTON HOLSTER BLD 10FT (ELECTRODE) IMPLANT
PORT ACCESS TROCAR AIRSEAL 12 (TROCAR) ×2 IMPLANT
PORT ACCESS TROCAR AIRSEAL 5M (TROCAR) ×2
POUCH SPECIMEN RETRIEVAL 10MM (ENDOMECHANICALS) ×8 IMPLANT
SEAL CANN UNIV 5-8 DVNC XI (MISCELLANEOUS) ×8 IMPLANT
SEAL XI 5MM-8MM UNIVERSAL (MISCELLANEOUS) ×8
SET TRI-LUMEN FLTR TB AIRSEAL (TUBING) ×4 IMPLANT
SURGIFLO W/THROMBIN 8M KIT (HEMOSTASIS) IMPLANT
SUT MNCRL AB 4-0 PS2 18 (SUTURE) IMPLANT
SUT VIC AB 0 CT1 36 (SUTURE) IMPLANT
SUT VIC AB 2-0 SH 27 (SUTURE) ×4
SUT VIC AB 2-0 SH 27XBRD (SUTURE) ×2 IMPLANT
SUT VIC AB 3-0 SH 27 (SUTURE)
SUT VIC AB 3-0 SH 27X BRD (SUTURE) IMPLANT
SUT VIC AB 4-0 PS2 18 (SUTURE) ×8 IMPLANT
SYR 10ML LL (SYRINGE) ×4 IMPLANT
SYR CONTROL 10ML LL (SYRINGE) ×8 IMPLANT
TRAY FOLEY W/BAG SLVR 14FR (SET/KITS/TRAYS/PACK) ×4 IMPLANT
TROCAR BLADELESS OPT 12M 100M (ENDOMECHANICALS) IMPLANT
TUBE CONNECTING 12'X1/4 (SUCTIONS) ×1
TUBE CONNECTING 12X1/4 (SUCTIONS) ×3 IMPLANT
UNDERPAD 30X36 HEAVY ABSORB (UNDERPADS AND DIAPERS) ×4 IMPLANT
WATER STERILE IRR 1000ML POUR (IV SOLUTION) ×4 IMPLANT
YANKAUER SUCT BULB TIP NO VENT (SUCTIONS) IMPLANT

## 2020-01-24 NOTE — Anesthesia Postprocedure Evaluation (Signed)
Anesthesia Post Note  Patient: Musette Hullum  Procedure(s) Performed: XI ROBOTIC ASSISTED TOTAL HYSTERECTOMY WITH BILATERAL SALPINGO OOPHORECTOMY (N/A Abdomen) SENTINEL NODE INJECTION BIOPSY, BILATERAL PELVIC LYMPH NODE DISSECTION (N/A )     Patient location during evaluation: PACU Anesthesia Type: General Level of consciousness: awake and alert and oriented Pain management: pain level controlled Vital Signs Assessment: post-procedure vital signs reviewed and stable Respiratory status: spontaneous breathing, nonlabored ventilation and respiratory function stable Cardiovascular status: blood pressure returned to baseline and stable Postop Assessment: no apparent nausea or vomiting Anesthetic complications: no   No complications documented.  Last Vitals:  Vitals:   01/24/20 1645 01/24/20 1700  BP: 99/70 126/75  Pulse: 74 74  Resp: 16 18  Temp:    SpO2: 100% 100%    Last Pain:  Vitals:   01/24/20 1645  TempSrc:   PainSc: 0-No pain                 Hadassa Cermak A.

## 2020-01-24 NOTE — Transfer of Care (Signed)
Immediate Anesthesia Transfer of Care Note  Patient: Betty Sanchez  Procedure(s) Performed: XI ROBOTIC ASSISTED TOTAL HYSTERECTOMY WITH BILATERAL SALPINGO OOPHORECTOMY (N/A Abdomen) SENTINEL NODE INJECTION BIOPSY, BILATERAL PELVIC LYMPH NODE DISSECTION (N/A )  Patient Location: PACU  Anesthesia Type:General  Level of Consciousness: sedated  Airway & Oxygen Therapy: Patient Spontanous Breathing and Patient connected to nasal cannula oxygen  Post-op Assessment: Report given to RN  Post vital signs: Reviewed and stable  Last Vitals:  Vitals Value Taken Time  BP 99/70 01/24/20 1645  Temp    Pulse 74 01/24/20 1647  Resp 17 01/24/20 1647  SpO2 100 % 01/24/20 1647  Vitals shown include unvalidated device data.  Last Pain:  Vitals:   01/24/20 1146  TempSrc: Oral         Complications: No complications documented.

## 2020-01-24 NOTE — Anesthesia Preprocedure Evaluation (Addendum)
Anesthesia Evaluation  Patient identified by MRN, date of birth, ID band Patient awake    Reviewed: Allergy & Precautions, NPO status , Patient's Chart, lab work & pertinent test results  History of Anesthesia Complications Negative for: history of anesthetic complications  Airway Mallampati: II  TM Distance: >3 FB Neck ROM: Full    Dental  (+) Loose,    Pulmonary neg pulmonary ROS,    Pulmonary exam normal        Cardiovascular hypertension, Normal cardiovascular exam     Neuro/Psych Anxiety negative neurological ROS     GI/Hepatic negative GI ROS, Neg liver ROS,   Endo/Other  Hypothyroidism   Renal/GU negative Renal ROS  negative genitourinary   Musculoskeletal negative musculoskeletal ROS (+)   Abdominal   Peds  Hematology negative hematology ROS (+)   Anesthesia Other Findings   Reproductive/Obstetrics                          Anesthesia Physical Anesthesia Plan  ASA: II  Anesthesia Plan: General   Post-op Pain Management:    Induction: Intravenous  PONV Risk Score and Plan: 4 or greater and Ondansetron, Dexamethasone, Treatment may vary due to age or medical condition and Midazolam  Airway Management Planned: Oral ETT  Additional Equipment: None  Intra-op Plan:   Post-operative Plan: Extubation in OR  Informed Consent: I have reviewed the patients History and Physical, chart, labs and discussed the procedure including the risks, benefits and alternatives for the proposed anesthesia with the patient or authorized representative who has indicated his/her understanding and acceptance.     Dental advisory given  Plan Discussed with:   Anesthesia Plan Comments:         Anesthesia Quick Evaluation

## 2020-01-24 NOTE — Op Note (Signed)
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 On itnra-abdominal entry, normal upper abdominal survey, stomach, small and large bowel, and omentum. Normal appearing bilateral adnexa. Uterus 8cm. Hemosiderin staining along left pelvis c/w history of endometriosis. Mapping to channels that met on the right external iliac artery - no clearly lymphatic tissue. Mapping along the broad ligament on the left without obvious SLN.  No intra-abdominal or pelvic evidence of disease.   Estimated Blood Loss:  75 cc      Total IV Fluids: see I&O flowsheet         Specimens: uterus, cervix, bilateral tubes and ovaries, right external iliac lymph node (confirmed no nodal tissue by pathology), bilateral pelvic LND         Complications:  None apparent; patient tolerated the procedure well.         Disposition: PACU - hemodynamically stable.  Procedure Details  The patient was seen in the Holding Room. The risks, benefits, complications, treatment options, and expected outcomes were discussed with the patient.  The patient concurred with the proposed plan, giving informed consent.  The site of surgery properly noted/marked. The patient was identified as Patent attorney and the procedure verified as a Robotic-assisted hysterectomy with bilateral salpingo oophorectomy with SLN biopsy.   After induction of anesthesia, the patient was draped and prepped in the usual sterile manner. Patient was placed in supine position after anesthesia and draped and prepped in the usual sterile manner as follows: Her arms were tucked to her side  with all appropriate precautions.  The shoulders were stabilized with padded shoulder blocks applied to the acromium processes.  The patient was placed in the semi-lithotomy position in [place].  The perineum and vagina were prepped with CholoraPrep. The patient was draped after the CholoraPrep had been allowed to dry for 3 minutes.  A Time Out was held and the above information confirmed.  The urethra was prepped with Betadine. Foley catheter was placed.  Given narrow introitus, 10cc of 0.25% marcaine was injected and an episiotomy created. A sterile speculum was placed in the vagina.  The cervix was grasped with a single-tooth tenaculum. 466m total of ICG was injected into the cervical stroma at 2 and 9 o'clock with 1cc injected at a 1cm and 249mdepth (concentration 0.66m77ml) in all locations. The cervix was dilated with PraKennon Roundslators.  The ZUMI uterine manipulator with a small colpotomizer ring was placed without difficulty.  A pneum occluder balloon was placed over the manipulator.  OG tube placement was confirmed and to suction.   Next, a 10 mm skin incision was made 1 cm below the subcostal margin in the midclavicular line.  The 5 mm Optiview port and scope was used for direct entry.  Opening pressure was under 10 mm CO2.  The abdomen was insufflated and the findings were noted as above.   At this point and all points during the procedure, the patient's intra-abdominal pressure did not exceed 15 mmHg. Next, an 8 mm skin incision was made superior to the umbilicus and a right and left port were placed about 8 cm lateral to the robot port on the right and left side.  A fourth arm  was placed on the right.  The 5 mm assist trocar was exchanged for a 10-12 mm port. All ports were placed under direct visualization.  The patient was placed in steep Trendelenburg.  Bowel was folded away into the upper abdomen.  The robot was docked in the normal manner.  The right and left peritoneum were opened parallel  to the IP ligament to open the retroperitoneal spaces bilaterally. The round ligaments were transected. The SLN mapping was performed in bilateral pelvic basins. After identifying the ureters, the para rectal and paravesical spaces were opened up entirely with careful dissection below the level of the ureters bilaterally and to the depth of the uterine artery origin in order to skeletonize the uterine "web" and ensure visualization of all parametrial channels. The para-aortic basins were carefully exposed and evaluated for isolated para-aortic SLN's. Lymphatic channels were identified travelling to the external iliac artery on the right and along the broad ligament to the adnexa on the left. No para-aortic lymphatics noted. On the right, what appeared to be a possible SLN was removed on the external iliac artery; however, once excised, this did not appear to be lymphatic tissue. No obvious SLN was noted on the left. .   The hysterectomy was started.  The ureter was again noted to be on the medial leaf of the broad ligament.  The peritoneum above the ureter was incised and stretched and the infundibulopelvic ligament was skeletonized, cauterized and cut.  The posterior peritoneum was taken down to the level of the KOH ring.  The anterior peritoneum was also taken down.  The bladder flap was created to the level of the KOH ring.  The uterine artery on the right side was skeletonized, cauterized and cut in the normal manner.  A similar procedure was performed on the left.  The colpotomy was made and the uterus, cervix, bilateral ovaries and tubes were amputated and delivered through the vagina.  Pedicles were inspected and excellent hemostasis was achieved.  The uterus was sent for frozen section and returned showing low grade cancer that was invasive and >2cm. Decision was made to send right specimen to see if lymphatic tissue. Unfortunately, as I suspected, this was not lymphatic tissue.   A pelvic lymph  dissection was performed on the right with the following borders: proximally the bifurcation of the common iliac, distally the circumflex iliac vein, laterally the genitofemoral nerve, the medial border was the superior vesicle artery and the deep border was the obturator nerve. All lymphatic tissue was removed and sent to Pathology. A similar procedure was performed on the contralateral side.   The colpotomy at the vaginal cuff was closed with Vicryl on a CT1 needle in a running manner.  Irrigation was used and excellent hemostasis was achieved.  At this point in the procedure was completed.  Robotic instruments were removed under direct visulaization.  The robot was undocked. The fascia at the 10-12 mm port was closed with 0 Vicryl on a UR-5 needle.  The subcuticular tissue was closed with 4-0 Vicryl and the skin was closed with 4-0 Monocryl in a subcuticular manner.  Dermabond was applied.    The vagina was swabbed with  minimal bleeding noted.   All sponge, lap and needle counts were correct x  3. Foley catheter was removed after running 2-0 Vicryl as used to achieve hemostasis of the episiotomy.  The patient was transferred to the recovery room in stable condition.  Jeral Pinch, MD

## 2020-01-24 NOTE — Interval H&P Note (Signed)
History and Physical Interval Note: Plan for definitive robotic staging with SLN biopsy for CAH, concern for underlying EMCA.  01/24/2020 11:12 AM  Mercadies Szydlowski  has presented today for surgery, with the diagnosis of COMPLEX ATYPICAL HYPERPLASIA.  The various methods of treatment have been discussed with the patient and family. After consideration of risks, benefits and other options for treatment, the patient has consented to  Procedure(s): XI ROBOTIC ASSISTED TOTAL HYSTERECTOMY WITH BILATERAL SALPINGO OOPHORECTOMY (N/A) SENTINEL NODE BIOPSY, POSSIBLE LYMPH NODE DISSECTION, POSSIBLE LAPAROTOMY (N/A) as a surgical intervention.  The patient's history has been reviewed, patient examined, no change in status, stable for surgery.  I have reviewed the patient's chart and labs.  Questions were answered to the patient's satisfaction.     Lafonda Mosses

## 2020-01-24 NOTE — Anesthesia Procedure Notes (Addendum)
Procedure Name: Intubation Date/Time: 01/24/2020 1:01 PM Performed by: Bonney Aid, CRNA Pre-anesthesia Checklist: Patient identified, Emergency Drugs available, Suction available and Patient being monitored Patient Re-evaluated:Patient Re-evaluated prior to induction Oxygen Delivery Method: Circle system utilized Preoxygenation: Pre-oxygenation with 100% oxygen Induction Type: IV induction Ventilation: Mask ventilation without difficulty Laryngoscope Size: Mac and 3 Grade View: Grade II Tube type: Oral Tube size: 7.0 mm Number of attempts: 1 Airway Equipment and Method: Stylet and Oral airway Placement Confirmation: ETT inserted through vocal cords under direct vision,  positive ETCO2 and breath sounds checked- equal and bilateral Secured at: 21 cm Tube secured with: Tape Dental Injury: Teeth and Oropharynx as per pre-operative assessment

## 2020-01-24 NOTE — Discharge Instructions (Addendum)
01/24/2020  Return to work: 4-6 weeks if applicable  Activity: 1. Be up and out of the bed during the day.  Take a nap if needed.  You may walk up steps but be careful and use the hand rail.  Stair climbing will tire you more than you think, you may need to stop part way and rest.   2. No lifting or straining for 6 weeks.  3. No driving for 1 week(s).  Do not drive if you are taking narcotic pain medicine.  4. Shower daily.  Use soap and water on your incision and pat dry; don't rub.  No tub baths until cleared by your surgeon.   5. No sexual activity and nothing in the vagina for 8 weeks.  6. You may experience a small amount of clear drainage from your incisions, which is normal.  If the drainage persists or increases, please call the office.  7. You may experience vaginal spotting after surgery or around the 6-8 week mark from surgery when the stitches at the top of the vagina begin to dissolve.  The spotting is normal but if you experience heavy bleeding, call our office.  8. Take Tylenol first for pain and only use narcotic pain medication for severe pain not relieved by the Tylenol.  Monitor your Tylenol intake to a max of 4,000 mg.  Diet: 1. Low sodium Heart Healthy Diet is recommended.  2. It is safe to use a laxative, such as Miralax or Colace, if you have difficulty moving your bowels. You can take Sennakot at bedtime every evening to keep bowel movements regular and to prevent constipation.    Wound Care: 1. Keep clean and dry.  Shower daily.  Reasons to call the Doctor:  Fever - Oral temperature greater than 100.4 degrees Fahrenheit  Foul-smelling vaginal discharge  Difficulty urinating  Nausea and vomiting  Increased pain at the site of the incision that is unrelieved with pain medicine.  Difficulty breathing with or without chest pain  New calf pain especially if only on one side  Sudden, continuing increased vaginal bleeding with or without clots.     Contacts: For questions or concerns you should contact:  Dr. Jeral Pinch at [phone]  Joylene John, NP at [phone]  After Hours: call [phone] and have the GYN Oncologist paged/contacted   Post Anesthesia Home Care Instructions  Activity: Get plenty of rest for the remainder of the day. A responsible individual must stay with you for 24 hours following the procedure.  For the next 24 hours, DO NOT: -Drive a car -Paediatric nurse -Drink alcoholic beverages -Take any medication unless instructed by your physician -Make any legal decisions or sign important papers.  Meals: Start with liquid foods such as gelatin or soup. Progress to regular foods as tolerated. Avoid greasy, spicy, heavy foods. If nausea and/or vomiting occur, drink only clear liquids until the nausea and/or vomiting subsides. Call your physician if vomiting continues.  Special Instructions/Symptoms: Your throat may feel dry or sore from the anesthesia or the breathing tube placed in your throat during surgery. If this causes discomfort, gargle with warm salt water. The discomfort should disappear within 24 hours.  If you had a scopolamine patch placed behind your ear for the management of post- operative nausea and/or vomiting:  1. The medication in the patch is effective for 72 hours, after which it should be removed.  Wrap patch in a tissue and discard in the trash. Wash hands thoroughly with soap and water.  2. You may remove the patch earlier than 72 hours if you experience unpleasant side effects which may include dry mouth, dizziness or visual disturbances. 3. Avoid touching the patch. Wash your hands with soap and water after contact with the patch.

## 2020-01-25 ENCOUNTER — Telehealth: Payer: Self-pay | Admitting: Gynecologic Oncology

## 2020-01-25 ENCOUNTER — Encounter (HOSPITAL_BASED_OUTPATIENT_CLINIC_OR_DEPARTMENT_OTHER): Payer: Self-pay | Admitting: Gynecologic Oncology

## 2020-01-25 NOTE — Telephone Encounter (Signed)
Called the patient to discuss surgery in detail, events of surgery, findings and reason for bilateral pelvic LND. All questions answered. She is tolerating Po with N/V, voiding freely. Denies flatus yet. Having some difficulty with movement of her left leg. Will keep me posted on these symptoms. Bilateral obturator nerves visualized during lymph node dissection yesterday and intact at the end of the procedure. Symptoms may be due to positioning for surgery or from stretch injury of obturator. Suspect they will improve over the coming days.  Jeral Pinch MD Gynecologic Oncology

## 2020-01-29 ENCOUNTER — Telehealth: Payer: Self-pay

## 2020-01-29 NOTE — Telephone Encounter (Signed)
Betty Sanchez states she is doing well. She  is eating , drinking, and urinating well. She has had a good BM. Afebrile.  Incisions D&I The bruising around the incision sites is improving.  The left groin is sore. No swelling or redness.  The movement of the left leg is improving as well. She is taking tylenol for pain prn. Pt aware of appointments and the office number [phone] if she has any questions or concerns.

## 2020-01-30 ENCOUNTER — Encounter: Payer: Self-pay | Admitting: Gynecologic Oncology

## 2020-01-31 ENCOUNTER — Other Ambulatory Visit: Payer: Self-pay

## 2020-01-31 ENCOUNTER — Encounter: Payer: Self-pay | Admitting: Gynecologic Oncology

## 2020-01-31 ENCOUNTER — Inpatient Hospital Stay: Payer: Medicare PPO | Attending: Gynecologic Oncology | Admitting: Gynecologic Oncology

## 2020-01-31 DIAGNOSIS — Z90710 Acquired absence of both cervix and uterus: Secondary | ICD-10-CM

## 2020-01-31 DIAGNOSIS — C541 Malignant neoplasm of endometrium: Secondary | ICD-10-CM

## 2020-01-31 DIAGNOSIS — Z90722 Acquired absence of ovaries, bilateral: Secondary | ICD-10-CM

## 2020-01-31 NOTE — Progress Notes (Signed)
Gynecologic Oncology Telehealth Consult Note: Gyn-Onc  I connected with Betty Sanchez on 01/31/20 at  9:15 AM EDT by telephone and verified that I am speaking with the correct person using two identifiers.  I discussed the limitations, risks, security and privacy concerns of performing an evaluation and management service by telemedicine and the availability of in-person appointments. I also discussed with the patient that there may be a patient responsible charge related to this service. The patient expressed understanding and agreed to proceed.  Other persons participating in the visit and their role in the encounter: none.  Patient's location: home Provider's location: hospital  Reason for Visit: pathology review, treatment discussion, post-op follow-up  Treatment History: Oncology History  Endometrial cancer ([place])  10/26/2019 Initial Biopsy   EMB: A. ENDOMETRIUM, BIOPSY:  - Scant benign endocervical glandular type epithelium present in the  background of a majority of blood.  - No distinct endometrial tissue present for evaluation.    12/18/2019 Surgery   D&C: A. ENDOMETRIUM, CURETTAGE:  -  Benign squamous and endocervical glandular epithelium with  microglandular adenosis  -  Benign lower uterine segment  -  No malignancy identified  -  See comment    01/10/2020 Surgery   Hysteroscopic endometrial mass resection: A. ENDOMETRIUM, RESECTION:  - Complex atypical hyperplasia, at least partially involving an  endometrial polyp.   01/24/2020 Surgery   TRH/BSO, SLN injection, bilateral pelvic LND  On EUA, small mobile uterus. On itnra-abdominal entry, normal upper abdominal survey, stomach, small and large bowel, and omentum. Normal appearing bilateral adnexa. Uterus 8cm. Hemosiderin staining along left pelvis c/w history of endometriosis. Mapping to channels that met on the right external iliac artery - no clearly lymphatic tissue. Mapping along the broad ligament on the left  without obvious SLN.  No intra-abdominal or pelvic evidence of disease.    01/24/2020 Initial Diagnosis   Endometrial cancer ([place])   01/24/2020 Pathology Results   UTERUS, CERVIX, BILATERAL FALLOPIAN TUBES AND OVARIES:  - Uterus:       Endomyometrium: Endometrioid adenocarcinoma, FIGO grade 1, spanning  2.7 cm.            Tumor invades more than one half of the myometrium.            Leiomyoma.            See oncology table.       Serosa: Unremarkable. No malignancy.  - Cervix: Benign squamous and endocervical mucosa. No dysplasia or  malignancy.  - Bilateral ovaries: Benign serous cystadenoma. No malignancy.  - Bilateral fallopian tubes: Adhesions. No malignancy.   B. SOFT TISSUE, RIGHT EXTERNAL ILIAC, BIOPSY:  - Benign soft tissue.  - No lymphoid tissue identified.   C. LYMPH NODES, RIGHT PELVIC, REGIONAL SECTION:  - Six of six lymph nodes negative for malignancy (0/6).   D. LYMPH NODES, LEFT PELVIC, REGIONAL SECTION:  - Nine of nine lymph nodes negative for malignancy (0/9).    ONCOLOGY TABLE:   UTERUS, CARCINOMA OR CARCINOSARCOMA   Procedure: Total hysterectomy and bilateral salpingo-oophorectomy with  lymph node resections  Histologic type: Endometrioid adenocarcinoma  Histologic Grade: FIGO grade I  Myometrial invasion:       Depth of invasion: 8 mm       Myometrial thickness: 12 mm  Uterine Serosa Involvement: Not identified  Cervical stromal involvement: Not identified  Extent of involvement of other organs: Not identified  Lymphovascular invasion: Not identified  Regional Lymph Nodes:       Examined:  0 Sentinel                               15 non-sentinel                               15 total        Lymph nodes with metastasis: 0        Isolated tumor cells (<0.2 mm): 0        Micrometastasis:  (>0.2 mm and < 2.0 mm): 0        Macrometastasis: (>2.0 mm): 0        Extracapsular extension: Not applicable  Representative Tumor Block: A5  MMR / MSI  testing: Will be ordered    01/24/2020 Pathologic Stage   Stage IB, grade 1 No LVSI, negative LNDs   01/24/2020 Cancer Staging   Staging form: Corpus Uteri - Carcinoma and Carcinosarcoma, AJCC 8th Edition - Clinical stage from 01/24/2020: FIGO Stage IB (cT1b, cN0, cM0) - Signed by Lafonda Mosses, MD on 01/31/2020     Interval History: Patient reports healing well from surgery. Peri-incisional bruising is improving. Has been feeling very anxious about pathology results.   Past Medical/Surgical History: Past Medical History:  Diagnosis Date  . Abnormal uterine bleeding (AUB)   . Anxiety    very anxious in a medical setting  . Cataract 2019   bilateral eyes; corrected with surgery  . Fall 12/2015   multiple falls in week   . Hypertension   . Hypothyroidism     Past Surgical History:  Procedure Laterality Date  . APPENDECTOMY  age 41  . CATARACT EXTRACTION W/ INTRAOCULAR LENS  IMPLANT, BILATERAL  2019  . DILATATION & CURETTAGE/HYSTEROSCOPY WITH MYOSURE N/A 01/10/2020   Procedure: Jacklynn Barnacle WITH ENDOMETRIAL SAMPLING;  Surgeon: Lafonda Mosses, MD;  Location: [facility];  Service: Gynecology;  Laterality: N/A;  . HYSTEROSCOPY WITH D & C N/A 12/18/2019   Procedure: DILATATION AND CURETTAGE /HYSTEROSCOPY;  Surgeon: Woodroe Mode, MD;  Location: [place];  Service: Gynecology;  Laterality: N/A;  . ROBOTIC ASSISTED TOTAL HYSTERECTOMY WITH BILATERAL SALPINGO OOPHERECTOMY N/A 01/24/2020   Procedure: XI ROBOTIC ASSISTED TOTAL HYSTERECTOMY WITH BILATERAL SALPINGO OOPHORECTOMY;  Surgeon: Lafonda Mosses, MD;  Location: [facility];  Service: Gynecology;  Laterality: N/A;  . SENTINEL NODE BIOPSY N/A 01/24/2020   Procedure: SENTINEL NODE INJECTION BIOPSY, BILATERAL PELVIC LYMPH NODE DISSECTION;  Surgeon: Lafonda Mosses, MD;  Location: [facility];  Service: Gynecology;  Laterality: N/A;  . TONSILLECTOMY  age 73    Family  History  Problem Relation Age of Onset  . Breast cancer Neg Hx   . Ovarian cancer Neg Hx   . Endometrial cancer Neg Hx   . Colon cancer Neg Hx     Social History   Socioeconomic History  . Marital status: Single    Spouse name: Not on file  . Number of children: 0  . Years of education: 10  . Highest education level: Not on file  Occupational History  . Not on file  Tobacco Use  . Smoking status: Never Smoker  . Smokeless tobacco: Never Used  Vaping Use  . Vaping Use: Never used  Substance and Sexual Activity  . Alcohol use: Never    Alcohol/week: 1.0 standard drink    Types: 1 Glasses of  wine per week  . Drug use: Never  . Sexual activity: Not Currently  Other Topics Concern  . Not on file  Social History Narrative  . Not on file   Social Determinants of Health   Financial Resource Strain: Low Risk   . Difficulty of Paying Living Expenses: Not hard at all  Food Insecurity: No Food Insecurity  . Worried About Charity fundraiser in the Last Year: Never true  . Ran Out of Food in the Last Year: Never true  Transportation Needs: No Transportation Needs  . Lack of Transportation (Medical): No  . Lack of Transportation (Non-Medical): No  Physical Activity: Sufficiently Active  . Days of Exercise per Week: 7 days  . Minutes of Exercise per Session: 60 min  Stress: No Stress Concern Present  . Feeling of Stress : Not at all  Social Connections:   . Frequency of Communication with Friends and Family:   . Frequency of Social Gatherings with Friends and Family:   . Attends Religious Services:   . Active Member of Clubs or Organizations:   . Attends Archivist Meetings:   Marland Kitchen Marital Status:     Current Medications:  Current Outpatient Medications:  .  ALPRAZolam (XANAX) 1 MG tablet, Take 0.5-1 tablets (0.5-1 mg total) by mouth 2 (two) times daily as needed for anxiety., Disp: 20 tablet, Rfl: 0 .  amLODipine (NORVASC) 10 MG tablet, TAKE 1 TABLET(10 MG) BY  MOUTH DAILY, Disp: 90 tablet, Rfl: 2 .  hydrochlorothiazide (HYDRODIURIL) 12.5 MG tablet, Take 1 tablet (12.5 mg total) by mouth daily., Disp: 30 tablet, Rfl: 2 .  levothyroxine (SYNTHROID) 25 MCG tablet, Take 1 tablet (25 mcg total) by mouth daily before breakfast., Disp: 30 tablet, Rfl: 2 .  senna-docusate (SENOKOT-S) 8.6-50 MG tablet, Take 2 tablets by mouth at bedtime. For AFTER surgery, do not take if having diarrhea, Disp: 30 tablet, Rfl: 0 .  traMADol (ULTRAM) 50 MG tablet, Take 1 tablet (50 mg total) by mouth every 6 (six) hours as needed for severe pain. For AFTER surgery only, do not take and drive, Disp: 10 tablet, Rfl: 0  Review of Symptoms: Positive for soreness, anxiety.  Physical Exam: Deferred due to limitations of phone visit.  Laboratory & Radiologic Studies: A. UTERUS, CERVIX, BILATERAL FALLOPIAN TUBES AND OVARIES:  - Uterus:    Endomyometrium: Endometrioid adenocarcinoma, FIGO grade 1, spanning  2.7 cm.       Tumor invades more than one half of the myometrium.       Leiomyoma.       See oncology table.    Serosa: Unremarkable. No malignancy.  - Cervix: Benign squamous and endocervical mucosa. No dysplasia or  malignancy.  - Bilateral ovaries: Benign serous cystadenoma. No malignancy.  - Bilateral fallopian tubes: Adhesions. No malignancy.   B. SOFT TISSUE, RIGHT EXTERNAL ILIAC, BIOPSY:  - Benign soft tissue.  - No lymphoid tissue identified.   C. LYMPH NODES, RIGHT PELVIC, REGIONAL SECTION:  - Six of six lymph nodes negative for malignancy (0/6).   D. LYMPH NODES, LEFT PELVIC, REGIONAL SECTION:  - Nine of nine lymph nodes negative for malignancy (0/9).   Assessment & Plan: Betty Sanchez is a 74 y.o. woman with Stage IB, grade 1 endometrioid endometrial adenocarcinoma who presents for discussion of final pathology and treatment discussion.  Patient is doing well from a post-op standpoint. We reviewed her final pathology results in detail  and all questions answered. We discussed high-intermediate risk factors  and, given her age, that vaginal brachytherapy is recommended to decrease the risk of location vaginal recurrence. The patient would like to think about this recommendation and we will discuss further at her follow-up visit in August.   I discussed the assessment and treatment plan with the patient. The patient was provided with an opportunity to ask questions and all were answered. The patient agreed with the plan and demonstrated an understanding of the instructions.   The patient was advised to call back or see an in-person evaluation if the symptoms worsen or if the condition fails to improve as anticipated.   25 minutes of total time was spent for this patient encounter, including preparation, face-to-face counseling with the patient and coordination of care, and documentation of the encounter.   Jeral Pinch, MD  Division of Gynecologic Oncology  Department of Obstetrics and Gynecology  [facility]

## 2020-02-01 LAB — SURGICAL PATHOLOGY

## 2020-02-02 ENCOUNTER — Encounter: Payer: PRIVATE HEALTH INSURANCE | Admitting: Gynecologic Oncology

## 2020-02-12 ENCOUNTER — Telehealth: Payer: Self-pay | Admitting: Oncology

## 2020-02-12 ENCOUNTER — Other Ambulatory Visit: Payer: Self-pay | Admitting: Oncology

## 2020-02-12 ENCOUNTER — Encounter: Payer: Self-pay | Admitting: Family Medicine

## 2020-02-12 DIAGNOSIS — C541 Malignant neoplasm of endometrium: Secondary | ICD-10-CM

## 2020-02-12 NOTE — Telephone Encounter (Signed)
Called and spoke with patient. She was a surprised that she had received a call following cancer conference and was informed that someone was going to call her to set up radiation treatments. She has an upcoming appointment with Dr. Berline Lopes to discuss. She has been checking her blood pressures at home and they are running in the 110/ 70s with the highest reading 130/80.  She has been having some dry mouth and fatigue that she relates to HCTZ 12.5 mg.  Discussed stopping this and continuing to check her blood pressure 2-3 times per week and keeping a log.  She will follow-up with me next month to recheck TSH, lipids and review blood pressure log.

## 2020-02-12 NOTE — Progress Notes (Signed)
Gynecologic Oncology Multi-Disciplinary Disposition Conference Note  Date of the Conference: 02/12/2020  Patient Name: Betty Sanchez  Referring Provider: Dr. Roselie Awkward Primary GYN Oncologist: Dr. Berline Lopes  Stage/Disposition:  Stage IB, grade 1 endometrioid endometrial adenocarcinoma. Disposition is to vaginal brachytherapy.   This Multidisciplinary conference took place involving physicians from [place], [place], Radiation Oncology, Pathology, Radiology along with the Gynecologic Oncology Nurse Practitioner and RN.  Comprehensive assessment of the patient's malignancy, staging, need for surgery, chemotherapy, radiation therapy, and need for further testing were reviewed. Supportive measures, both inpatient and following discharge were also discussed. The recommended plan of care is documented. Greater than 35 minutes were spent correlating and coordinating this patient's care.

## 2020-02-12 NOTE — Telephone Encounter (Signed)
Called Betty Sanchez and advised her of GYN Conference recommendations for vaginal brachytherapy.  She verbalized understanding and said she will discuss this with Dr. Berline Lopes at her appointment on Wednesday and does not want to schedule an appointment with radiation at this time.

## 2020-02-13 ENCOUNTER — Telehealth: Payer: Self-pay | Admitting: *Deleted

## 2020-02-13 ENCOUNTER — Encounter: Payer: Self-pay | Admitting: Family Medicine

## 2020-02-13 NOTE — Progress Notes (Signed)
Gynecologic Oncology Return Clinic Visit  02/14/20  Reason for Visit: post-op follow-up in the setting of early-stage uterine cancer  Treatment History: Oncology History Overview Note  IHC MMR normal   Endometrial cancer ([place])  10/26/2019 Initial Biopsy   EMB: A. ENDOMETRIUM, BIOPSY:  - Scant benign endocervical glandular type epithelium present in the  background of a majority of blood.  - No distinct endometrial tissue present for evaluation.    12/18/2019 Surgery   D&C: A. ENDOMETRIUM, CURETTAGE:  -  Benign squamous and endocervical glandular epithelium with  microglandular adenosis  -  Benign lower uterine segment  -  No malignancy identified  -  See comment    01/10/2020 Surgery   Hysteroscopic endometrial mass resection: A. ENDOMETRIUM, RESECTION:  - Complex atypical hyperplasia, at least partially involving an  endometrial polyp.   01/24/2020 Surgery   TRH/BSO, SLN injection, bilateral pelvic LND  On EUA, small mobile uterus. On itnra-abdominal entry, normal upper abdominal survey, stomach, small and large bowel, and omentum. Normal appearing bilateral adnexa. Uterus 8cm. Hemosiderin staining along left pelvis c/w history of endometriosis. Mapping to channels that met on the right external iliac artery - no clearly lymphatic tissue. Mapping along the broad ligament on the left without obvious SLN.  No intra-abdominal or pelvic evidence of disease.    01/24/2020 Initial Diagnosis   Endometrial cancer ([place])   01/24/2020 Pathology Results   UTERUS, CERVIX, BILATERAL FALLOPIAN TUBES AND OVARIES:  - Uterus:       Endomyometrium: Endometrioid adenocarcinoma, FIGO grade 1, spanning  2.7 cm.            Tumor invades more than one half of the myometrium.            Leiomyoma.            See oncology table.       Serosa: Unremarkable. No malignancy.  - Cervix: Benign squamous and endocervical mucosa. No dysplasia or  malignancy.  - Bilateral ovaries: Benign serous  cystadenoma. No malignancy.  - Bilateral fallopian tubes: Adhesions. No malignancy.   B. SOFT TISSUE, RIGHT EXTERNAL ILIAC, BIOPSY:  - Benign soft tissue.  - No lymphoid tissue identified.   C. LYMPH NODES, RIGHT PELVIC, REGIONAL SECTION:  - Six of six lymph nodes negative for malignancy (0/6).   D. LYMPH NODES, LEFT PELVIC, REGIONAL SECTION:  - Nine of nine lymph nodes negative for malignancy (0/9).    ONCOLOGY TABLE:   UTERUS, CARCINOMA OR CARCINOSARCOMA   Procedure: Total hysterectomy and bilateral salpingo-oophorectomy with  lymph node resections  Histologic type: Endometrioid adenocarcinoma  Histologic Grade: FIGO grade I  Myometrial invasion:       Depth of invasion: 8 mm       Myometrial thickness: 12 mm  Uterine Serosa Involvement: Not identified  Cervical stromal involvement: Not identified  Extent of involvement of other organs: Not identified  Lymphovascular invasion: Not identified  Regional Lymph Nodes:       Examined:       0 Sentinel                               15 non-sentinel                               15 total        Lymph nodes with metastasis: 0  Isolated tumor cells (<0.2 mm): 0        Micrometastasis:  (>0.2 mm and < 2.0 mm): 0        Macrometastasis: (>2.0 mm): 0        Extracapsular extension: Not applicable  Representative Tumor Block: A5  MMR / MSI testing: Will be ordered    01/24/2020 Pathologic Stage   Stage IB, grade 1 No LVSI, negative LNDs   01/24/2020 Cancer Staging   Staging form: Corpus Uteri - Carcinoma and Carcinosarcoma, AJCC 8th Edition - Clinical stage from 01/24/2020: FIGO Stage IB (cT1b, cN0, cM0) - Signed by Lafonda Mosses, MD on 01/31/2020     Interval History: The patient reports overall doing very well since her last phone call.  She has used Tylenol and ibuprofen as needed for pain although currently describes having no abdominal or pelvic pain.  She took Senokot for a couple of days but is now having  regular bowel function without the use of medications.  She denies any urinary symptoms.  She denies any vaginal bleeding, discharge, fevers, or chills.  She is back to walking 1 to 1-1/2 miles a day, which is down from her normal 5-7 miles a day.  She initially had some numbness and difficulty with certain movements of her left leg after surgery.  These resolved over several days.  She describes overall having had some emotional difficulty with the work-up process and diagnosis of cancer.  She is not wanting to socialize with others, which is very atypical for her.  Past Medical/Surgical History: Past Medical History:  Diagnosis Date  . Abnormal uterine bleeding (AUB)   . Anxiety    very anxious in a medical setting  . Cataract 2019   bilateral eyes; corrected with surgery  . Fall 12/2015   multiple falls in week   . Hypertension   . Hypothyroidism     Past Surgical History:  Procedure Laterality Date  . APPENDECTOMY  age 71  . CATARACT EXTRACTION W/ INTRAOCULAR LENS  IMPLANT, BILATERAL  2019  . DILATATION & CURETTAGE/HYSTEROSCOPY WITH MYOSURE N/A 01/10/2020   Procedure: Jacklynn Barnacle WITH ENDOMETRIAL SAMPLING;  Surgeon: Lafonda Mosses, MD;  Location: [facility];  Service: Gynecology;  Laterality: N/A;  . HYSTEROSCOPY WITH D & C N/A 12/18/2019   Procedure: DILATATION AND CURETTAGE /HYSTEROSCOPY;  Surgeon: Woodroe Mode, MD;  Location: [place];  Service: Gynecology;  Laterality: N/A;  . ROBOTIC ASSISTED TOTAL HYSTERECTOMY WITH BILATERAL SALPINGO OOPHERECTOMY N/A 01/24/2020   Procedure: XI ROBOTIC ASSISTED TOTAL HYSTERECTOMY WITH BILATERAL SALPINGO OOPHORECTOMY;  Surgeon: Lafonda Mosses, MD;  Location: [facility];  Service: Gynecology;  Laterality: N/A;  . SENTINEL NODE BIOPSY N/A 01/24/2020   Procedure: SENTINEL NODE INJECTION BIOPSY, BILATERAL PELVIC LYMPH NODE DISSECTION;  Surgeon: Lafonda Mosses, MD;  Location: [place];  Service: Gynecology;  Laterality: N/A;  . TONSILLECTOMY  age 4    Family History  Problem Relation Age of Onset  . Breast cancer Neg Hx   . Ovarian cancer Neg Hx   . Endometrial cancer Neg Hx   . Colon cancer Neg Hx     Social History   Socioeconomic History  . Marital status: Single    Spouse name: Not on file  . Number of children: 0  . Years of education: 61  . Highest education level: Not on file  Occupational History  . Not on file  Tobacco Use  . Smoking  status: Never Smoker  . Smokeless tobacco: Never Used  Vaping Use  . Vaping Use: Never used  Substance and Sexual Activity  . Alcohol use: Never    Alcohol/week: 1.0 standard drink    Types: 1 Glasses of wine per week  . Drug use: Never  . Sexual activity: Not Currently  Other Topics Concern  . Not on file  Social History Narrative  . Not on file   Social Determinants of Health   Financial Resource Strain: Low Risk   . Difficulty of Paying Living Expenses: Not hard at all  Food Insecurity: No Food Insecurity  . Worried About Charity fundraiser in the Last Year: Never true  . Ran Out of Food in the Last Year: Never true  Transportation Needs: No Transportation Needs  . Lack of Transportation (Medical): No  . Lack of Transportation (Non-Medical): No  Physical Activity: Sufficiently Active  . Days of Exercise per Week: 7 days  . Minutes of Exercise per Session: 60 min  Stress: No Stress Concern Present  . Feeling of Stress : Not at all  Social Connections:   . Frequency of Communication with Friends and Family:   . Frequency of Social Gatherings with Friends and Family:   . Attends Religious Services:   . Active Member of Clubs or Organizations:   . Attends Archivist Meetings:   Marland Kitchen Marital Status:     Current Medications:  Current Outpatient Medications:  .  ALPRAZolam (XANAX) 1 MG tablet, Take 0.5-1 tablets (0.5-1 mg total) by mouth 2 (two) times daily as needed for anxiety.,  Disp: 20 tablet, Rfl: 0 .  amLODipine (NORVASC) 10 MG tablet, TAKE 1 TABLET(10 MG) BY MOUTH DAILY, Disp: 90 tablet, Rfl: 2 .  levothyroxine (SYNTHROID) 25 MCG tablet, Take 1 tablet (25 mcg total) by mouth daily before breakfast., Disp: 30 tablet, Rfl: 2  Review of Systems: Pertinent positives as per HPI. Denies appetite changes, fevers, chills, fatigue, unexplained weight changes. Denies hearing loss, neck lumps or masses, mouth sores, ringing in ears or voice changes. Denies cough or wheezing.  Denies shortness of breath. Denies chest pain or palpitations. Denies leg swelling. Denies abdominal distention, pain, blood in stools, constipation, diarrhea, nausea, vomiting, or early satiety. Denies pain with intercourse, dysuria, frequency, hematuria or incontinence. Denies hot flashes, pelvic pain, vaginal bleeding or vaginal discharge.   Denies joint pain, back pain or muscle pain/cramps. Denies itching, rash, or wounds. Denies dizziness, headaches, numbness or seizures. Denies swollen lymph nodes or glands, denies easy bruising or bleeding. Denies anxiety, depression, confusion, or decreased concentration.  Physical Exam: BP (!) 133/92 (BP Location: Left Arm, Patient Position: Sitting)   Pulse (!) 140 Comment: Runs up at dr. office  Temp 98.2 F (36.8 C) (Oral)   Resp 18   Ht 5' 6"  (1.676 m)   Wt 160 lb (72.6 kg)   SpO2 100%   BMI 25.82 kg/m  General: Alert, oriented, no acute distress. HEENT: Normocephalic, atraumatic, sclera anicteric. Chest: Unlabored breathing on room air. Abdomen: soft, nontender.  Normoactive bowel sounds.  No masses or hepatosplenomegaly appreciated.  Well-healing laparoscopic incisions.  Dermabond removed off of several incisions and mattress stitches taken out of right mid abdominal incision. Extremities: Grossly normal range of motion.  Warm, well perfused.  No edema bilaterally. GU: Normal appearing external genitalia without erythema, excoriation, or  lesions.  Speculum exam reveals cuff intact, suture visible.  No bleeding or discharge.  Bimanual exam reveals cuff intact, no  fluctuance or tenderness.   Laboratory & Radiologic Studies: none new  Assessment & Plan: Isobelle Tuckett is a 74 y.o. 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  Letetia is doing exceptionally well postoperatively.  She is anxious to get back on her tractor and start increasing her activity.  We discussed lifting restrictions for the next 3 weeks until she is 6 weeks out from surgery.  Otherwise, I have encouraged her to increase her activity as tolerated.  I also normalized the feeling of fatigue in the setting of recent surgery.  We discussed in detail the standard of care recommendations in the setting of high-intermediate risk disease.  While she is overall at the lower end of the spectrum, her outer half myometrial invasion with her age over 36 place her in the high-intermediate risk category.  In this setting, vaginal brachytherapy is recommended to decrease the risk of local recurrence.  We discussed that for the high-intermediate risk group, her overall risk of disease recurrence is approximately 20-25%.  While vaginal brachytherapy was shown to be less toxic with overall no survival difference to pelvic radiation, we also know that many patients who do not undergo adjuvant radiation can be salvaged in the recurrent setting with radiation.  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  She describes an inability currently to think about the possibility of radiation treatment.  I discussed that vaginal brachytherapy is quite different than pelvic or other radiation.  At this time, her preference is to focus on healing.  She will reach out to me either by phone or MyChart message if she has any further questions or would like to discuss vaginal  brachytherapy further.  Per SGO recommendation surveillance guidelines, we will plan on visits every 3 months for the first year and then every 6 months.  We reviewed signs and symptoms that would be concerning for disease recurrence and the patient knows to call with any of these if they develop prior to her next scheduled visit.  All the patient's questions were answered today.  45 minutes of total time was spent for this patient encounter, including preparation, face-to-face counseling with the patient and coordination of care, and documentation of the encounter.  Jeral Pinch, MD  Division of Gynecologic Oncology  Department of Obstetrics and Gynecology  [facility]

## 2020-02-13 NOTE — Telephone Encounter (Signed)
Called patient to schedule appointment and she wishes to wait until she speaks with Dr. Berline Lopes. Will call back afterwards to schedule.

## 2020-02-14 ENCOUNTER — Telehealth: Payer: Self-pay | Admitting: *Deleted

## 2020-02-14 ENCOUNTER — Other Ambulatory Visit: Payer: Self-pay

## 2020-02-14 ENCOUNTER — Encounter: Payer: Self-pay | Admitting: Gynecologic Oncology

## 2020-02-14 ENCOUNTER — Other Ambulatory Visit: Payer: Self-pay | Admitting: Family Medicine

## 2020-02-14 ENCOUNTER — Inpatient Hospital Stay: Payer: Medicare PPO | Attending: Gynecologic Oncology | Admitting: Gynecologic Oncology

## 2020-02-14 VITALS — BP 133/92 | HR 140 | Temp 98.2°F | Resp 18 | Ht 66.0 in | Wt 160.0 lb

## 2020-02-14 DIAGNOSIS — Z90722 Acquired absence of ovaries, bilateral: Secondary | ICD-10-CM | POA: Diagnosis not present

## 2020-02-14 DIAGNOSIS — C541 Malignant neoplasm of endometrium: Secondary | ICD-10-CM | POA: Diagnosis not present

## 2020-02-14 DIAGNOSIS — Z90710 Acquired absence of both cervix and uterus: Secondary | ICD-10-CM | POA: Insufficient documentation

## 2020-02-14 DIAGNOSIS — F419 Anxiety disorder, unspecified: Secondary | ICD-10-CM | POA: Diagnosis not present

## 2020-02-14 DIAGNOSIS — E039 Hypothyroidism, unspecified: Secondary | ICD-10-CM | POA: Diagnosis not present

## 2020-02-14 DIAGNOSIS — Z719 Counseling, unspecified: Secondary | ICD-10-CM

## 2020-02-14 DIAGNOSIS — Z79899 Other long term (current) drug therapy: Secondary | ICD-10-CM | POA: Diagnosis not present

## 2020-02-14 DIAGNOSIS — I10 Essential (primary) hypertension: Secondary | ICD-10-CM | POA: Insufficient documentation

## 2020-02-14 MED ORDER — LEVOTHYROXINE SODIUM 25 MCG PO TABS: 25.0000 ug | ORAL_TABLET | Freq: Every day | ORAL | 2 refills | Status: DC

## 2020-02-14 NOTE — Patient Instructions (Signed)
You are healing great from surgery!  Remember no lifting more than 10 pounds until 6 weeks after surgery.  Nothing in the vagina until 8 weeks after.  I will plan to see you every 3 months for the first year.  If you develop vaginal bleeding, discharge, abdominal pain, or any other new and concerning symptoms before your next visit, please call the clinic at [phone] to be seen sooner.

## 2020-02-14 NOTE — Telephone Encounter (Signed)
Spoke to patient which stated that after seeing Dr. Berline Lopes today, she will not be making an appointment in radiation oncology.

## 2020-02-16 ENCOUNTER — Encounter (HOSPITAL_COMMUNITY): Payer: Self-pay | Admitting: Gynecologic Oncology

## 2020-03-04 DIAGNOSIS — Z961 Presence of intraocular lens: Secondary | ICD-10-CM | POA: Diagnosis not present

## 2020-03-11 ENCOUNTER — Other Ambulatory Visit: Payer: Self-pay | Admitting: Family Medicine

## 2020-03-11 DIAGNOSIS — R7989 Other specified abnormal findings of blood chemistry: Secondary | ICD-10-CM

## 2020-03-11 DIAGNOSIS — I10 Essential (primary) hypertension: Secondary | ICD-10-CM

## 2020-03-13 ENCOUNTER — Other Ambulatory Visit: Payer: Self-pay

## 2020-03-13 ENCOUNTER — Other Ambulatory Visit (INDEPENDENT_AMBULATORY_CARE_PROVIDER_SITE_OTHER): Payer: Medicare PPO

## 2020-03-13 DIAGNOSIS — I10 Essential (primary) hypertension: Secondary | ICD-10-CM

## 2020-03-13 DIAGNOSIS — R7989 Other specified abnormal findings of blood chemistry: Secondary | ICD-10-CM

## 2020-03-13 LAB — LIPID PANEL
Cholesterol: 247 mg/dL — ABNORMAL HIGH (ref 0–200)
HDL: 71.5 mg/dL (ref >39.00)
LDL Cholesterol: 150 mg/dL — ABNORMAL HIGH (ref 0–99)
NonHDL: 175.31
Total CHOL/HDL Ratio: 3
Triglycerides: 127 mg/dL (ref 0.0–149.0)
VLDL: 25.4 mg/dL (ref 0.0–40.0)

## 2020-03-13 LAB — TSH: TSH: 5.48 u[IU]/mL — ABNORMAL HIGH (ref 0.35–4.50)

## 2020-03-13 LAB — T4, FREE: Free T4: 0.79 ng/dL (ref 0.60–1.60)

## 2020-03-14 LAB — T3: T3, Total: 131 ng/dL (ref 76–181)

## 2020-03-15 ENCOUNTER — Encounter: Payer: Self-pay | Admitting: Family Medicine

## 2020-03-15 ENCOUNTER — Other Ambulatory Visit: Payer: Self-pay

## 2020-03-15 ENCOUNTER — Ambulatory Visit (INDEPENDENT_AMBULATORY_CARE_PROVIDER_SITE_OTHER): Payer: Medicare PPO | Admitting: Family Medicine

## 2020-03-15 VITALS — BP 138/78 | HR 100 | Temp 97.9°F | Ht 65.0 in | Wt 164.8 lb

## 2020-03-15 DIAGNOSIS — I10 Essential (primary) hypertension: Secondary | ICD-10-CM

## 2020-03-15 DIAGNOSIS — E039 Hypothyroidism, unspecified: Secondary | ICD-10-CM | POA: Diagnosis not present

## 2020-03-15 DIAGNOSIS — C541 Malignant neoplasm of endometrium: Secondary | ICD-10-CM | POA: Diagnosis not present

## 2020-03-15 DIAGNOSIS — E7800 Pure hypercholesterolemia, unspecified: Secondary | ICD-10-CM | POA: Diagnosis not present

## 2020-03-15 DIAGNOSIS — E038 Other specified hypothyroidism: Secondary | ICD-10-CM

## 2020-03-15 NOTE — Patient Instructions (Addendum)
Start with saline nasal spray a couple of times a day.  Next step would be fluticasone nasal spray. (can use Sensamist- no scent)  For eyes- can use over the counter antihistamine eye drops  Olive oil, avocado, coconut, grass fed butter- good. Avoid packaged, processed foods

## 2020-03-15 NOTE — Progress Notes (Signed)
Subjective:    Patient ID: Betty Sanchez, female    DOB: 05/07/1946, 74 y.o.   MRN: [id number]  HPI Chief Complaint  Patient presents with  . Follow-up    lipid panel, thyroid and blood pressure recheck   Had hysterectomy- left hip weakness improved  Burning of eyes, nasal drip, nasal congestion in am, post nasal drainage. Some pressure under eyes. No headaches. Had recent eye exam, was given eye drops  HTN- BP log from home 110s- 130s/ 70-80s. Checks at least a week.   Hypothyroidism-previously was subclinical hyperthyroidism.  Was started on low-dose levothyroxine 25 mcg by gynecology.  Labs repeated prior to visit today.  Hyperlipidemia- discussed lipid with patient and ASCVD scoring.  The 10-year ASCVD risk score Mikey Bussing DC Brooke Bonito., et al., 2013) is: 18.1%   Values used to calculate the score:     Age: 41 years     Sex: Female     Is Non-Hispanic African American: No     Diabetic: No     Tobacco smoker: No     Systolic Blood Pressure: 287 mmHg     Is BP treated: Yes     HDL Cholesterol: 71.5 mg/dL     Total Cholesterol: 247 mg/dL   Review of Systems Per HPI    Objective:   Physical Exam Vitals reviewed.  Constitutional:      General: She is not in acute distress.    Appearance: Normal appearance. She is normal weight. She is not ill-appearing, toxic-appearing or diaphoretic.  HENT:     Head: Normocephalic and atraumatic.  Eyes:     Conjunctiva/sclera: Conjunctivae normal.  Cardiovascular:     Rate and Rhythm: Normal rate.  Pulmonary:     Effort: Pulmonary effort is normal.  Musculoskeletal:     Right lower leg: No edema.     Left lower leg: No edema.  Skin:    General: Skin is warm and dry.  Neurological:     Mental Status: She is alert and oriented to person, place, and time.       BP (!) 156/78   Pulse 100   Temp 97.9 F (36.6 C) (Temporal)   Ht 5\' 5"  (1.651 m)   Wt 164 lb 12 oz (74.7 kg)   SpO2 98%   BMI 27.42 kg/m  Wt Readings from Last 3  Encounters:  03/15/20 164 lb 12 oz (74.7 kg)  02/14/20 160 lb (72.6 kg)  01/24/20 158 lb 6.4 oz (71.8 kg)   BP Readings from Last 3 Encounters:  03/15/20 (!) 156/78  02/14/20 (!) 133/92  01/24/20 136/80  BP: 138/78       Assessment & Plan:  1. Essential hypertension -Home reading log demonstrates good control.  Recheck today looks good.  Continue current medication, amlodipine 10 mg daily  2. Elevated LDL cholesterol level -Discussed her lab values as well as ASCVD risk calculation.  Discussed starting a statin.  Patient prefers to continue to work on diet for next 6 months and readdress at follow-up.  Will consider CT calcium scoring.  3. Subclinical hypothyroidism -She is on very low-dose levothyroxine with normal free T4 and mildly elevated TSH.  Will have her stop her levothyroxine as she feels that it makes her feel bad.  Recheck in 6 months, sooner if symptoms of hypothyroidism.  4. Endometrial cancer [facility] -Reviewed recommendations from oncology and continued surveillance  This visit occurred during the SARS-CoV-2 public health emergency.  Safety protocols were in place, including screening questions  prior to the visit, additional usage of staff PPE, and extensive cleaning of exam room while observing appropriate contact time as indicated for disinfecting solutions.      Clarene Reamer, FNP-BC  [facility], [facility]  03/15/2020 10:28 AM

## 2020-03-29 ENCOUNTER — Other Ambulatory Visit: Payer: Medicare PPO

## 2020-03-29 ENCOUNTER — Other Ambulatory Visit: Payer: Self-pay

## 2020-03-29 DIAGNOSIS — Z20822 Contact with and (suspected) exposure to covid-19: Secondary | ICD-10-CM | POA: Diagnosis not present

## 2020-03-30 LAB — NOVEL CORONAVIRUS, NAA: SARS-CoV-2, NAA: NOT DETECTED

## 2020-03-30 LAB — SARS-COV-2, NAA 2 DAY TAT

## 2020-05-21 ENCOUNTER — Inpatient Hospital Stay: Payer: Medicare PPO | Attending: Gynecologic Oncology | Admitting: Gynecologic Oncology

## 2020-05-21 ENCOUNTER — Other Ambulatory Visit: Payer: Self-pay

## 2020-05-21 ENCOUNTER — Encounter: Payer: Self-pay | Admitting: Gynecologic Oncology

## 2020-05-21 VITALS — BP 147/75 | HR 125 | Temp 96.7°F | Resp 18 | Ht 66.0 in | Wt 167.2 lb

## 2020-05-21 DIAGNOSIS — C541 Malignant neoplasm of endometrium: Secondary | ICD-10-CM | POA: Insufficient documentation

## 2020-05-21 DIAGNOSIS — I10 Essential (primary) hypertension: Secondary | ICD-10-CM | POA: Insufficient documentation

## 2020-05-21 DIAGNOSIS — E039 Hypothyroidism, unspecified: Secondary | ICD-10-CM | POA: Diagnosis not present

## 2020-05-21 DIAGNOSIS — Z79899 Other long term (current) drug therapy: Secondary | ICD-10-CM | POA: Insufficient documentation

## 2020-05-21 DIAGNOSIS — Z90710 Acquired absence of both cervix and uterus: Secondary | ICD-10-CM | POA: Diagnosis not present

## 2020-05-21 DIAGNOSIS — Z90722 Acquired absence of ovaries, bilateral: Secondary | ICD-10-CM | POA: Insufficient documentation

## 2020-05-21 NOTE — Progress Notes (Signed)
Gynecologic Oncology Return Clinic Visit  05/21/20  Reason for Visit: surveillance visit in the setting of early-stage uterine cancer  Treatment History: Oncology History Overview Note  IHC MMR normal MSI-stable   Endometrial cancer ([place])  10/26/2019 Initial Biopsy   EMB: A. ENDOMETRIUM, BIOPSY:  - Scant benign endocervical glandular type epithelium present in the  background of a majority of blood.  - No distinct endometrial tissue present for evaluation.    12/18/2019 Surgery   D&C: A. ENDOMETRIUM, CURETTAGE:  -  Benign squamous and endocervical glandular epithelium with  microglandular adenosis  -  Benign lower uterine segment  -  No malignancy identified  -  See comment    01/10/2020 Surgery   Hysteroscopic endometrial mass resection: A. ENDOMETRIUM, RESECTION:  - Complex atypical hyperplasia, at least partially involving an  endometrial polyp.   01/24/2020 Surgery   TRH/BSO, SLN injection, bilateral pelvic LND  On EUA, small mobile uterus. On itnra-abdominal entry, normal upper abdominal survey, stomach, small and large bowel, and omentum. Normal appearing bilateral adnexa. Uterus 8cm. Hemosiderin staining along left pelvis c/w history of endometriosis. Mapping to channels that met on the right external iliac artery - no clearly lymphatic tissue. Mapping along the broad ligament on the left without obvious SLN.  No intra-abdominal or pelvic evidence of disease.    01/24/2020 Initial Diagnosis   Endometrial cancer ([place])   01/24/2020 Pathology Results   UTERUS, CERVIX, BILATERAL FALLOPIAN TUBES AND OVARIES:  - Uterus:       Endomyometrium: Endometrioid adenocarcinoma, FIGO grade 1, spanning  2.7 cm.            Tumor invades more than one half of the myometrium.            Leiomyoma.            See oncology table.       Serosa: Unremarkable. No malignancy.  - Cervix: Benign squamous and endocervical mucosa. No dysplasia or  malignancy.  - Bilateral ovaries: Benign  serous cystadenoma. No malignancy.  - Bilateral fallopian tubes: Adhesions. No malignancy.   B. SOFT TISSUE, RIGHT EXTERNAL ILIAC, BIOPSY:  - Benign soft tissue.  - No lymphoid tissue identified.   C. LYMPH NODES, RIGHT PELVIC, REGIONAL SECTION:  - Six of six lymph nodes negative for malignancy (0/6).   D. LYMPH NODES, LEFT PELVIC, REGIONAL SECTION:  - Nine of nine lymph nodes negative for malignancy (0/9).    ONCOLOGY TABLE:   UTERUS, CARCINOMA OR CARCINOSARCOMA   Procedure: Total hysterectomy and bilateral salpingo-oophorectomy with  lymph node resections  Histologic type: Endometrioid adenocarcinoma  Histologic Grade: FIGO grade I  Myometrial invasion:       Depth of invasion: 8 mm       Myometrial thickness: 12 mm  Uterine Serosa Involvement: Not identified  Cervical stromal involvement: Not identified  Extent of involvement of other organs: Not identified  Lymphovascular invasion: Not identified  Regional Lymph Nodes:       Examined:       0 Sentinel                               15 non-sentinel                               15 total        Lymph nodes with metastasis: 0  Isolated tumor cells (<0.2 mm): 0        Micrometastasis:  (>0.2 mm and < 2.0 mm): 0        Macrometastasis: (>2.0 mm): 0        Extracapsular extension: Not applicable  Representative Tumor Block: A5  MMR / MSI testing: Will be ordered    01/24/2020 Pathologic Stage   Stage IB, grade 1 No LVSI, negative LNDs   01/24/2020 Cancer Staging   Staging form: Corpus Uteri - Carcinoma and Carcinosarcoma, AJCC 8th Edition - Clinical stage from 01/24/2020: FIGO Stage IB (cT1b, cN0, cM0) - Signed by Lafonda Mosses, MD on 01/31/2020     Interval History: Betty Sanchez presents today for her first follow-up visit.  She is doing well since I last saw her.  She denies any vaginal bleeding or discharge.  She endorses a good appetite without nausea or emesis.  She endorses regular bowel and bladder  function.  Initially after surgery, she had some weakness of her left hip and upper leg as well as difficulty with her gait.  This improved and has now completely resolved after working with her Physiological scientist.  She is walking 3-5 miles a day.  She is looking forward to [place] the [place] to spend [place] with her family.  They are planning something as a surprise for her birthday in December.  She has plans to travel to [place] in February.  Past Medical/Surgical History: Past Medical History:  Diagnosis Date   Abnormal uterine bleeding (AUB)    Anxiety    very anxious in a medical setting   Cataract 2019   bilateral eyes; corrected with surgery   Fall 12/2015   multiple falls in week    Hypertension    Hypothyroidism     Past Surgical History:  Procedure Laterality Date   APPENDECTOMY  age 63   CATARACT EXTRACTION W/ [place], BILATERAL  2019   DILATATION & CURETTAGE/HYSTEROSCOPY WITH MYOSURE N/A 01/10/2020   Procedure: [place];  Surgeon: Lafonda Mosses, MD;  Location: [facility];  Service: Gynecology;  Laterality: N/A;   HYSTEROSCOPY WITH D & C N/A 12/18/2019   Procedure: DILATATION AND CURETTAGE /HYSTEROSCOPY;  Surgeon: Woodroe Mode, MD;  Location: [place];  Service: Gynecology;  Laterality: N/A;   ROBOTIC ASSISTED TOTAL HYSTERECTOMY WITH BILATERAL SALPINGO OOPHERECTOMY N/A 01/24/2020   Procedure: XI ROBOTIC ASSISTED TOTAL HYSTERECTOMY WITH BILATERAL SALPINGO OOPHORECTOMY;  Surgeon: Lafonda Mosses, MD;  Location: [facility];  Service: Gynecology;  Laterality: N/A;   SENTINEL NODE BIOPSY N/A 01/24/2020   Procedure: SENTINEL NODE INJECTION BIOPSY, BILATERAL PELVIC LYMPH NODE DISSECTION;  Surgeon: Lafonda Mosses, MD;  Location: [place];  Service: Gynecology;  Laterality: N/A;   TONSILLECTOMY  age 63    Family History  Problem  Relation Age of Onset   Breast cancer Neg Hx    Ovarian cancer Neg Hx    Endometrial cancer Neg Hx    Colon cancer Neg Hx     Social History   Socioeconomic History   Marital status: Single    Spouse name: Not on file   Number of children: 0   Years of education: 17   Highest education level: Not on file  Occupational History   Not on file  Tobacco Use   Smoking status: Never Smoker   Smokeless tobacco: Never Used  Vaping Use   Vaping Use: Never used  Substance and Sexual Activity   Alcohol use: Never    Alcohol/week: 1.0 standard drink    Types: 1 Glasses of wine per week   Drug use: Never   Sexual activity: Not Currently  Other Topics Concern   Not on file  Social History Narrative   Not on file   Social Determinants of Health   Financial Resource Strain: Low Risk    Difficulty of Paying Living Expenses: Not hard at all  Food Insecurity: No Food Insecurity   Worried About Charity fundraiser in the Last Year: Never true   [place] in the Last Year: Never true  Transportation Needs: No Transportation Needs   Lack of Transportation (Medical): No   Lack of Transportation (Non-Medical): No  Physical Activity: Sufficiently Active   Days of Exercise per Week: 7 days   Minutes of Exercise per Session: 60 min  Stress: No Stress Concern Present   Feeling of Stress : Not at all  Social Connections:    Frequency of Communication with Friends and Family: Not on file   Frequency of Social Gatherings with Friends and Family: Not on file   Attends Religious Services: Not on Electrical engineer or Organizations: Not on file   Attends Archivist Meetings: Not on file   Marital Status: Not on file    Current Medications:  Current Outpatient Medications:    amLODipine (NORVASC) 10 MG tablet, TAKE 1 TABLET(10 MG) BY MOUTH DAILY, Disp: 90 tablet, Rfl: 2  Review of Systems: Denies appetite changes, fevers, chills,  fatigue, unexplained weight changes. Denies hearing loss, neck lumps or masses, mouth sores, ringing in ears or voice changes. Denies cough or wheezing.  Denies shortness of breath. Denies chest pain or palpitations. Denies leg swelling. Denies abdominal distention, pain, blood in stools, constipation, diarrhea, nausea, vomiting, or early satiety. Denies pain with intercourse, dysuria, frequency, hematuria or incontinence. Denies hot flashes, pelvic pain, vaginal bleeding or vaginal discharge.   Denies joint pain, back pain or muscle pain/cramps. Denies itching, rash, or wounds. Denies dizziness, headaches, numbness or seizures. Denies swollen lymph nodes or glands, denies easy bruising or bleeding. Denies anxiety, depression, confusion, or decreased concentration.  Physical Exam: BP (!) 147/75 (BP Location: Right Arm, Patient Position: Sitting)    Pulse (!) 125    Temp (!) 96.7 F (35.9 C) (Tympanic)    Resp 18    Ht 5' 6"  (1.676 m)    Wt 167 lb 3.2 oz (75.8 kg)    SpO2 100%    BMI 26.99 kg/m  General: Alert, oriented, no acute distress. HEENT: Normocephalic, atraumatic, sclera anicteric. Chest: Unlabored breathing on room air. Abdomen: soft, nontender.  Normoactive bowel sounds.  No masses or hepatosplenomegaly appreciated.  Well-healed incisions. Extremities: Grossly normal range of motion.  Warm, well perfused.  No edema bilaterally. Skin: No rashes or lesions noted. Lymphatics: No cervical, supraclavicular, or inguinal adenopathy. GU: Normal appearing external genitalia without erythema, excoriation, or lesions.  Speculum exam reveals moderately atrophic vaginal mucosa, no lesions or masses.  Bimanual exam reveals cuff intact, no nodularity.  Rectovaginal exam confirms these findings.  Laboratory & Radiologic Studies: None new  Assessment & Plan: Betty Sanchez is a 74 y.o. 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  Betty Sanchez is doing very well and is NED on exam today. Per SGO recommendation surveillance guidelines, we will plan on visits every 3-6 months for  the first year.  We reviewed signs and symptoms that would be concerning for disease recurrence and the patient knows to call with any of these if they develop prior to her next scheduled visit.  All the patient's questions were answered today.  34 minutes of total time was spent for this patient encounter, including preparation, face-to-face counseling with the patient and coordination of care, and documentation of the encounter.  Jeral Pinch, MD  Division of Gynecologic Oncology  Department of Obstetrics and Gynecology  [facility]

## 2020-05-21 NOTE — Patient Instructions (Signed)
Your exam is normal today! Please call the office after the new year to get scheduled to see me for your 6 months follow-up. If you develop any new symptoms between now and then (such as vaginal bleeding, pelvic pain, change to your bowel movements), please call the clinic at [phone] to be seen sooner.

## 2020-06-14 ENCOUNTER — Encounter: Payer: Self-pay | Admitting: Family Medicine

## 2020-06-27 ENCOUNTER — Encounter: Payer: Self-pay | Admitting: Family Medicine

## 2020-08-06 ENCOUNTER — Telehealth: Payer: Self-pay | Admitting: *Deleted

## 2020-08-06 NOTE — Telephone Encounter (Signed)
Patient called and scheduled a follow up appt for the beginning of June

## 2020-09-17 IMAGING — US US PELVIS COMPLETE WITH TRANSVAGINAL
1 series · 14 of 25 positions shown · non-contrast
Comparison: None

CLINICAL DATA: Postmenopausal bleeding



[Series 1: us pelvis (transabdominal only) · 29 acquisitions, 14 frames shown]
[im 1/29]
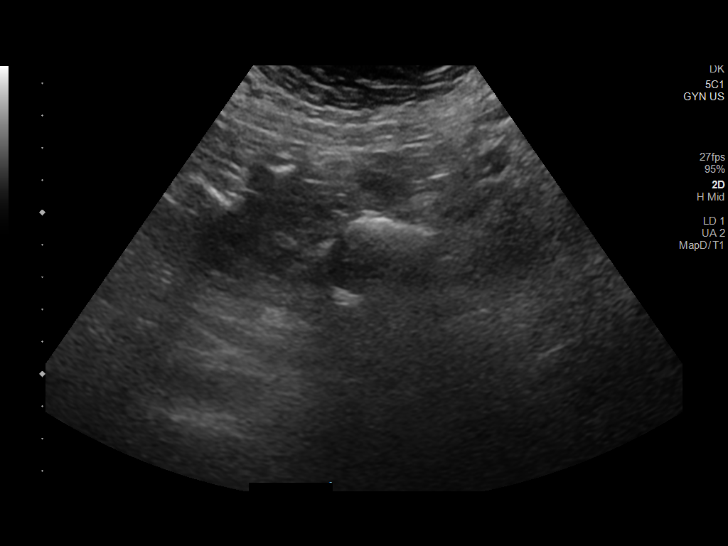
[im 3/29]
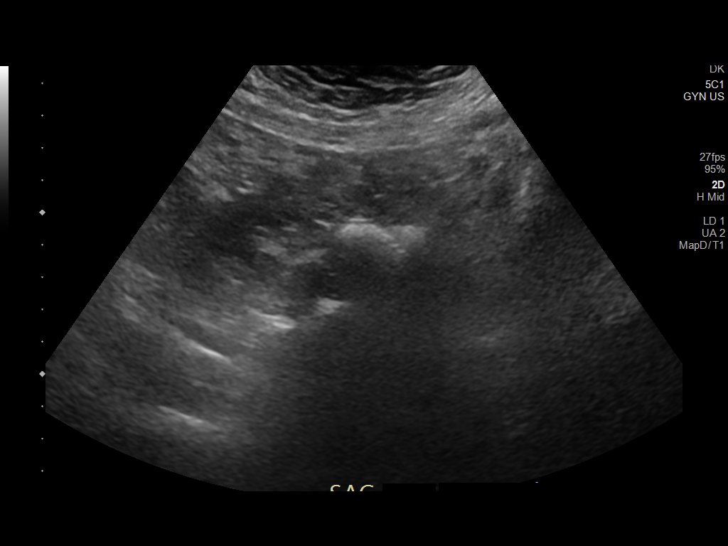
[im 5/29]
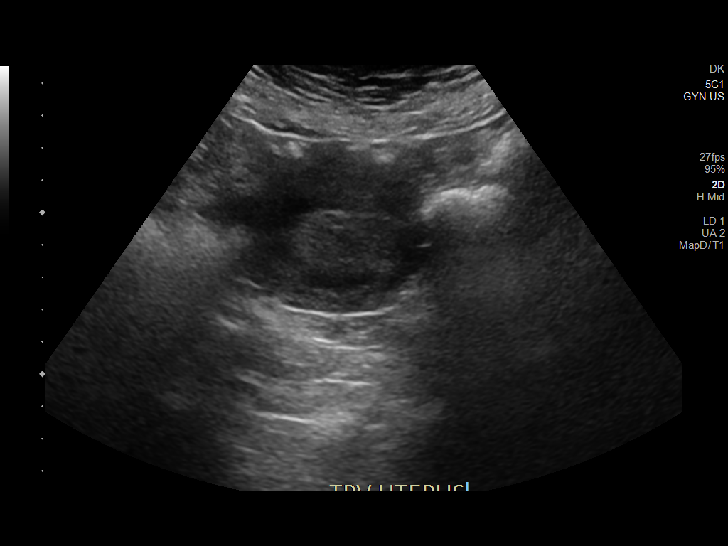
[im 8/29]
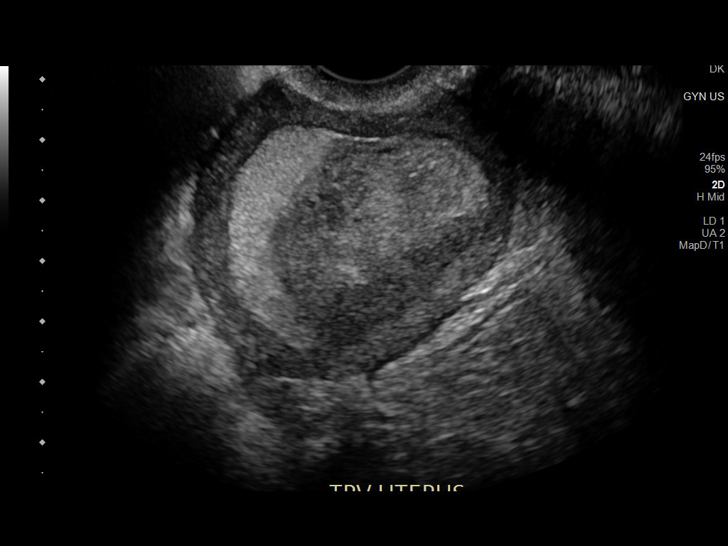
[im 10/29]
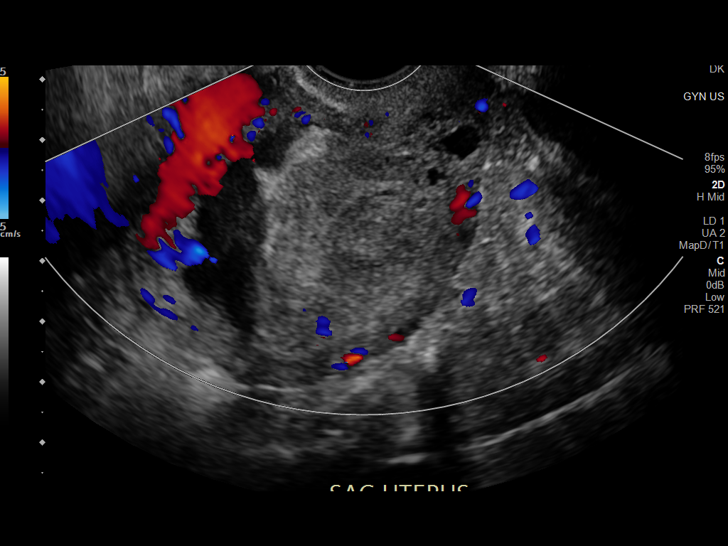
[im 11/29]
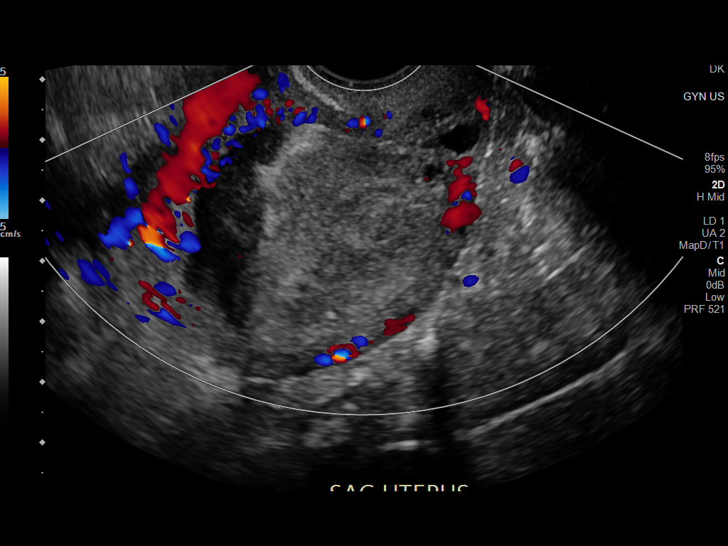
[im 13/29]
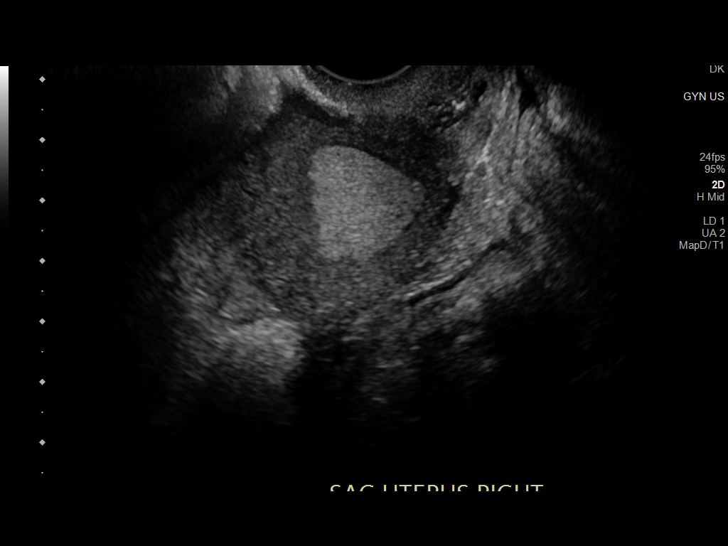
[im 16/29]
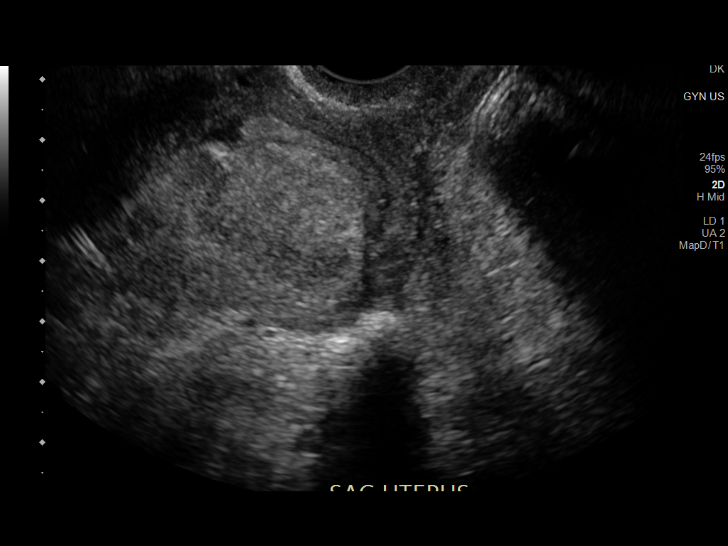
[im 18/29]
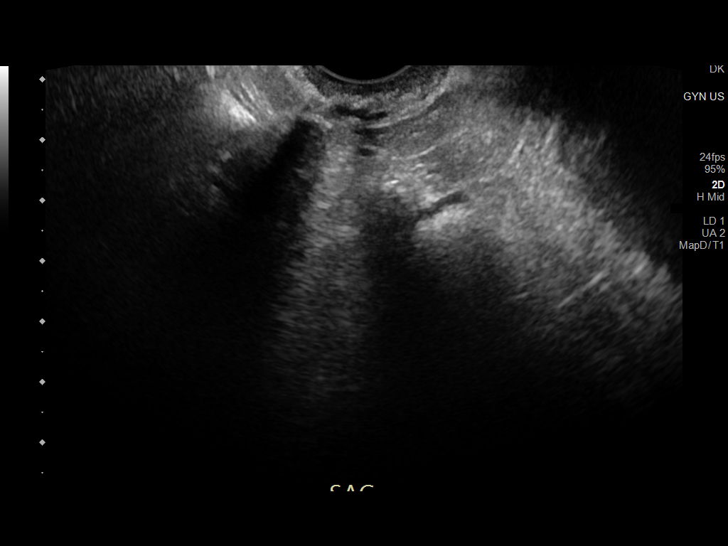
[im 19/29]
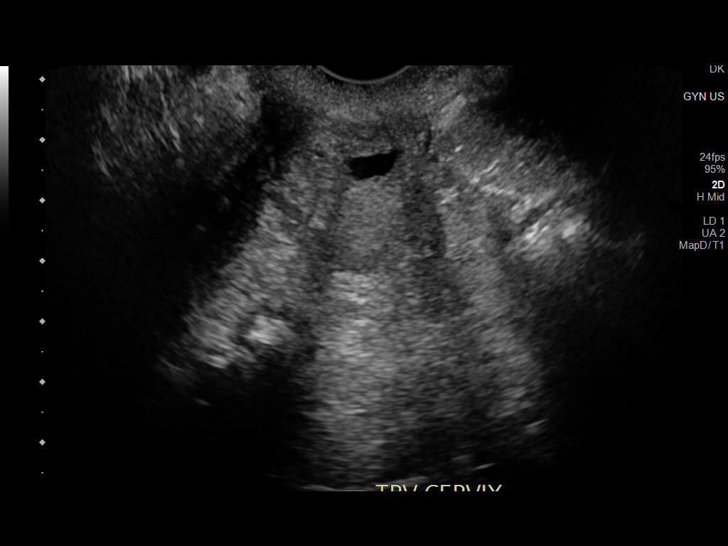
[im 22/29]
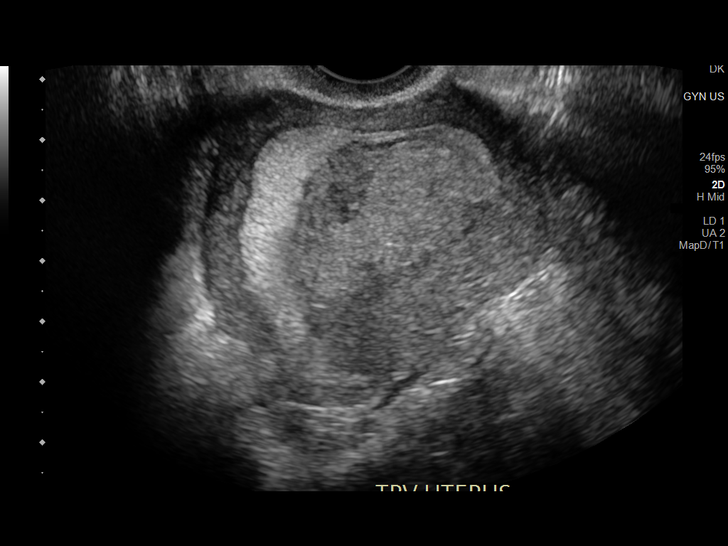
[im 24/29]
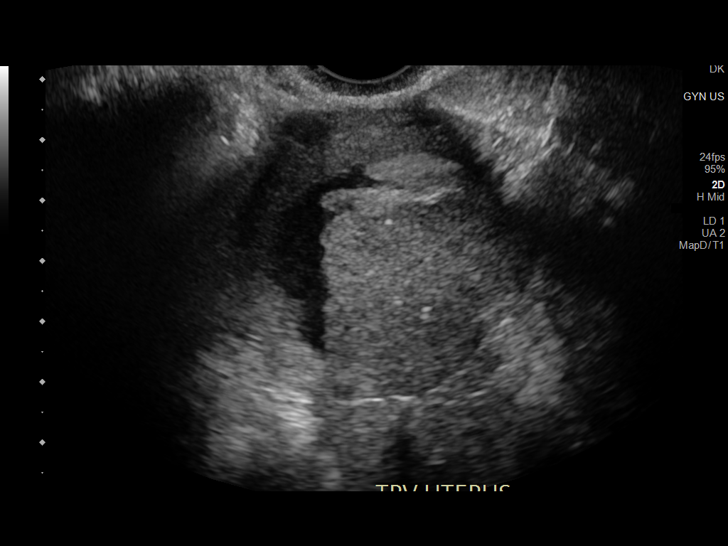
[im 26/29]
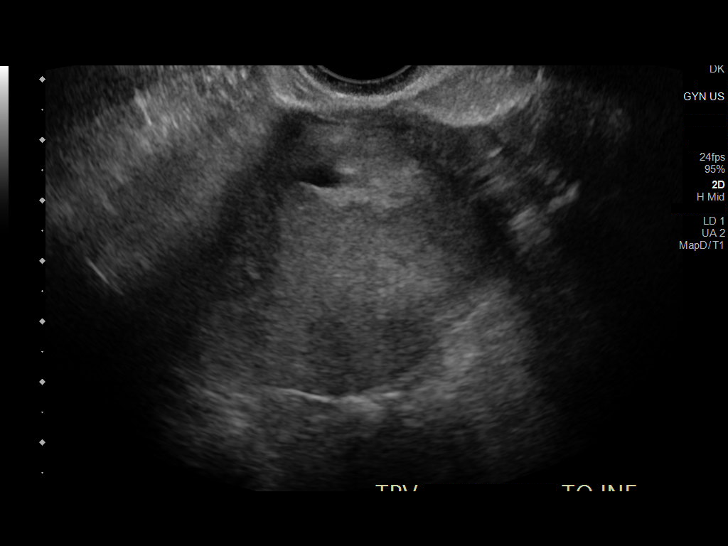
[im 29/29]
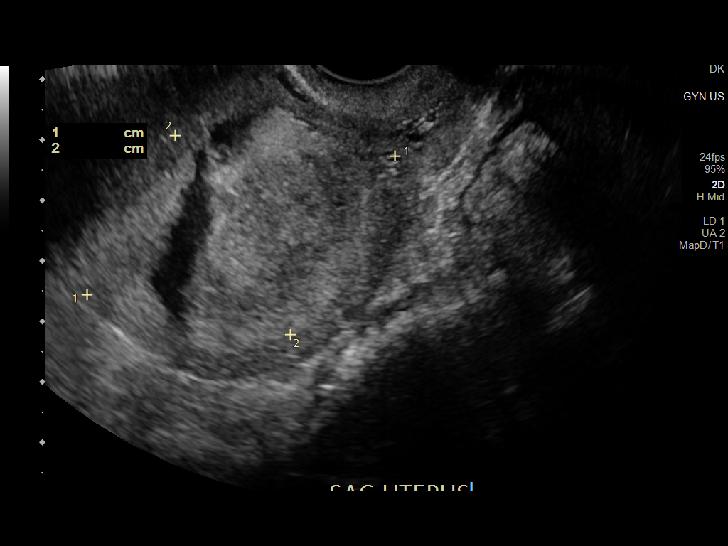

[14 of 25 positions shown; findings below may reference images not displayed]

FINDINGS: Uterus

Measurements: 8.0 x 5.1 by 5.3 cm = volume: 113 mL. No fibroids or
other mass visualized.

Endometrium

Thickness: 3.8 cm. There is masslike heterogeneous thickening of the
endometrium measuring 4.2 x 5.6 x 3.8 cm in total. Endometrial
neoplasm is not excluded, and endometrial sampling is recommended.

Right ovary

Not visualized

Left ovary

Not visualized

Other findings

No abnormal free fluid.
IMPRESSION: 1. Heterogeneous masslike thickening of the endometrium, highly
concerning for endometrial neoplasm. Endometrial sampling is
recommended.
2. Nonvisualization of the ovaries, which may be atrophic.

## 2020-09-23 ENCOUNTER — Other Ambulatory Visit (INDEPENDENT_AMBULATORY_CARE_PROVIDER_SITE_OTHER): Payer: Medicare PPO

## 2020-09-23 ENCOUNTER — Other Ambulatory Visit: Payer: Self-pay

## 2020-09-23 DIAGNOSIS — E039 Hypothyroidism, unspecified: Secondary | ICD-10-CM

## 2020-09-23 DIAGNOSIS — R5383 Other fatigue: Secondary | ICD-10-CM

## 2020-09-23 DIAGNOSIS — E785 Hyperlipidemia, unspecified: Secondary | ICD-10-CM

## 2020-09-23 LAB — LIPID PANEL
Cholesterol: 267 mg/dL — ABNORMAL HIGH (ref 0–200)
HDL: 76.7 mg/dL (ref >39.00)
LDL Cholesterol: 167 mg/dL — ABNORMAL HIGH (ref 0–99)
NonHDL: 190.08
Total CHOL/HDL Ratio: 3
Triglycerides: 117 mg/dL (ref 0.0–149.0)
VLDL: 23.4 mg/dL (ref 0.0–40.0)

## 2020-09-23 LAB — CBC WITH DIFFERENTIAL/PLATELET
Basophils Absolute: 0.1 10*3/uL (ref 0.0–0.1)
Basophils Relative: 1.1 % (ref 0.0–3.0)
Eosinophils Absolute: 0.2 10*3/uL (ref 0.0–0.7)
Eosinophils Relative: 2.6 % (ref 0.0–5.0)
HCT: 44.7 % (ref 36.0–46.0)
Hemoglobin: 15.3 g/dL — ABNORMAL HIGH (ref 12.0–15.0)
Lymphocytes Relative: 31.4 % (ref 12.0–46.0)
Lymphs Abs: 2.1 10*3/uL (ref 0.7–4.0)
MCHC: 34.3 g/dL (ref 30.0–36.0)
MCV: 90 fl (ref 78.0–100.0)
Monocytes Absolute: 0.6 10*3/uL (ref 0.1–1.0)
Monocytes Relative: 9.1 % (ref 3.0–12.0)
Neutro Abs: 3.7 10*3/uL (ref 1.4–7.7)
Neutrophils Relative %: 55.8 % (ref 43.0–77.0)
Platelets: 294 10*3/uL (ref 150.0–400.0)
RBC: 4.96 Mil/uL (ref 3.87–5.11)
RDW: 13.3 % (ref 11.5–15.5)
WBC: 6.7 10*3/uL (ref 4.0–10.5)

## 2020-09-23 LAB — HEPATIC FUNCTION PANEL
ALT: 10 U/L (ref 0–35)
AST: 13 U/L (ref 0–37)
Albumin: 4.5 g/dL (ref 3.5–5.2)
Alkaline Phosphatase: 62 U/L (ref 39–117)
Bilirubin, Direct: 0.1 mg/dL (ref 0.0–0.3)
Total Bilirubin: 0.8 mg/dL (ref 0.2–1.2)
Total Protein: 7.1 g/dL (ref 6.0–8.3)

## 2020-09-23 LAB — BASIC METABOLIC PANEL
BUN: 13 mg/dL (ref 6–23)
CO2: 30 mEq/L (ref 19–32)
Calcium: 9.9 mg/dL (ref 8.4–10.5)
Chloride: 102 mEq/L (ref 96–112)
Creatinine, Ser: 0.64 mg/dL (ref 0.40–1.20)
GFR: 87.79 mL/min (ref >60.00)
Glucose, Bld: 101 mg/dL — ABNORMAL HIGH (ref 70–99)
Potassium: 4.3 mEq/L (ref 3.5–5.1)
Sodium: 140 mEq/L (ref 135–145)

## 2020-09-23 LAB — T4, FREE: Free T4: 0.68 ng/dL (ref 0.60–1.60)

## 2020-09-23 LAB — T3, FREE: T3, Free: 3.3 pg/mL (ref 2.3–4.2)

## 2020-09-23 LAB — TSH: TSH: 10.1 u[IU]/mL — ABNORMAL HIGH (ref 0.35–4.50)

## 2020-09-24 NOTE — Progress Notes (Signed)
No critical labs need to be addressed urgently. We will discuss labs in detail at upcoming office visit.   

## 2020-09-25 ENCOUNTER — Ambulatory Visit: Payer: Medicare PPO | Admitting: Family Medicine

## 2020-09-25 ENCOUNTER — Telehealth: Payer: Self-pay

## 2020-09-25 DIAGNOSIS — I10 Essential (primary) hypertension: Secondary | ICD-10-CM

## 2020-09-25 MED ORDER — AMLODIPINE BESYLATE 10 MG PO TABS: ORAL_TABLET | ORAL | 0 refills | Status: DC

## 2020-09-25 NOTE — Telephone Encounter (Signed)
Pharmacy requests refill on: Amlodipine 10 mg   LAST REFILL: 12/14/2019 (Q-90, R-2) LAST OV: 03/15/2020 NEXT OV: 09/27/2020 PHARMACY: [place], [place]

## 2020-09-27 ENCOUNTER — Other Ambulatory Visit: Payer: Self-pay

## 2020-09-27 ENCOUNTER — Ambulatory Visit (INDEPENDENT_AMBULATORY_CARE_PROVIDER_SITE_OTHER): Payer: Medicare PPO | Admitting: Family Medicine

## 2020-09-27 VITALS — BP 142/84 | HR 123 | Temp 98.2°F | Ht 64.25 in | Wt 169.2 lb

## 2020-09-27 DIAGNOSIS — E038 Other specified hypothyroidism: Secondary | ICD-10-CM | POA: Insufficient documentation

## 2020-09-27 DIAGNOSIS — E7800 Pure hypercholesterolemia, unspecified: Secondary | ICD-10-CM

## 2020-09-27 DIAGNOSIS — C541 Malignant neoplasm of endometrium: Secondary | ICD-10-CM

## 2020-09-27 DIAGNOSIS — I10 Essential (primary) hypertension: Secondary | ICD-10-CM | POA: Diagnosis not present

## 2020-09-27 NOTE — Progress Notes (Signed)
Patient ID: Betty Sanchez, female    DOB: 03-30-1946, 75 y.o.   MRN: [id number]  This visit was conducted in person.  BP (!) 142/84   Pulse (!) 123   Temp 98.2 F (36.8 C) (Temporal)   Ht 5' 4.25" (1.632 m)   Wt 169 lb 4 oz (76.8 kg)   SpO2 98%   BMI 28.83 kg/m    CC:  Chief Complaint  Patient presents with  . Transitions Of Care  . Follow-up    6 mos- HT     Subjective:   HPI: Betty Sanchez is a 75 y.o. female presenting on 09/27/2020 for Transitions Of Care and Follow-up (6 mos- HT )  Hypertension:  On amlodipine 10 mg daily.  Has white coat HTN element BP Readings from Last 3 Encounters:  09/27/20 (!) 142/84  05/21/20 (!) 147/75  03/15/20 138/78  Using medication without problems or lightheadedness:  none Chest pain with exertion: none Edema: none Short of breath: none Average home BPs: 124-135/75-81 Other issues: Walks 3-5 miles per day,  works part-time.[employer].  Elevated Cholesterol: Checked last OV with previous PCP... statin indicated... she preferred at that time to work on lifestyle changes and re-eval. Lab Results  Component Value Date   CHOL 267 (H) 09/23/2020   HDL 76.70 09/23/2020   LDLCALC 167 (H) 09/23/2020   TRIG 117.0 09/23/2020   CHOLHDL 3 09/23/2020  Using medications without problems: Muscle aches:  Diet compliance: good Exercise: yes. Other complaints:  The 10-year ASCVD risk score Mikey Bussing DC Brooke Bonito., et al., 2013) is: 21.3%   Values used to calculate the score:     Age: 48 years     Sex: Female     Is Non-Hispanic African American: No     Diabetic: No     Tobacco smoker: No     Systolic Blood Pressure: 209 mmHg     Is BP treated: Yes     HDL Cholesterol: 76.7 mg/dL     Total Cholesterol: 267 mg/dL   Subclinical hypothyroidism:  Stopped low dose levothyroxine per PCP in 03/2020 as it made her feel bad... had palpitations.  Normal Free t3 and free t4.Marland Kitchen TSH  History of endometrial cancer, S/P TRH/SBO 01/2020. She  opted against  Radiation.  Followed by Dr. Gwynn Burly.  Followed with active surveillance.. seen every 3-6 months for 3 years.      Relevant past medical, surgical, family and social history reviewed and updated as indicated. Interim medical history since our last visit reviewed. Allergies and medications reviewed and updated. Outpatient Medications Prior to Visit  Medication Sig Dispense Refill  . amLODipine (NORVASC) 10 MG tablet TAKE 1 TABLET(10 MG) BY MOUTH DAILY 90 tablet 0   No facility-administered medications prior to visit.     Per HPI unless specifically indicated in ROS section below Review of Systems  Constitutional: Negative for fatigue and fever.  HENT: Negative for congestion.   Eyes: Negative for pain.  Respiratory: Negative for cough and shortness of breath.   Cardiovascular: Negative for chest pain, palpitations and leg swelling.  Gastrointestinal: Negative for abdominal pain.  Genitourinary: Negative for dysuria and vaginal bleeding.  Musculoskeletal: Negative for back pain.  Neurological: Negative for syncope, light-headedness and headaches.  Psychiatric/Behavioral: Negative for dysphoric mood.   Objective:  BP (!) 142/84   Pulse (!) 123   Temp 98.2 F (36.8 C) (Temporal)   Ht 5' 4.25" (1.632 m)   Wt 169 lb 4 oz (76.8 kg)  SpO2 98%   BMI 28.83 kg/m   Wt Readings from Last 3 Encounters:  09/27/20 169 lb 4 oz (76.8 kg)  05/21/20 167 lb 3.2 oz (75.8 kg)  03/15/20 164 lb 12 oz (74.7 kg)      Physical Exam Constitutional:      General: She is not in acute distress.    Appearance: Normal appearance. She is well-developed. She is not ill-appearing or toxic-appearing.  HENT:     Head: Normocephalic.     Right Ear: Hearing, tympanic membrane, ear canal and external ear normal. Tympanic membrane is not erythematous, retracted or bulging.     Left Ear: Hearing, tympanic membrane, ear canal and external ear normal. Tympanic membrane is not erythematous,  retracted or bulging.     Nose: No mucosal edema or rhinorrhea.     Right Sinus: No maxillary sinus tenderness or frontal sinus tenderness.     Left Sinus: No maxillary sinus tenderness or frontal sinus tenderness.     Mouth/Throat:     Pharynx: Uvula midline.  Eyes:     General: Lids are normal. Lids are everted, no foreign bodies appreciated.     Conjunctiva/sclera: Conjunctivae normal.     Pupils: Pupils are equal, round, and reactive to light.  Neck:     Thyroid: No thyroid mass or thyromegaly.     Vascular: No carotid bruit.     Trachea: Trachea normal.  Cardiovascular:     Rate and Rhythm: Normal rate and regular rhythm.     Pulses: Normal pulses.     Heart sounds: Normal heart sounds, S1 normal and S2 normal. No murmur heard. No friction rub. No gallop.   Pulmonary:     Effort: Pulmonary effort is normal. No tachypnea or respiratory distress.     Breath sounds: Normal breath sounds. No decreased breath sounds, wheezing, rhonchi or rales.  Abdominal:     General: Bowel sounds are normal.     Palpations: Abdomen is soft.     Tenderness: There is no abdominal tenderness.  Musculoskeletal:     Cervical back: Normal range of motion and neck supple.  Skin:    General: Skin is warm and dry.     Findings: No rash.  Neurological:     Mental Status: She is alert.  Psychiatric:        Mood and Affect: Mood is not anxious or depressed.        Speech: Speech normal.        Behavior: Behavior normal. Behavior is cooperative.        Thought Content: Thought content normal.        Judgment: Judgment normal.       Results for orders placed or performed in visit on 09/23/20  Lipid panel  Result Value Ref Range   Cholesterol 267 (H) 0 - 200 mg/dL   Triglycerides 117.0 0.0 - 149.0 mg/dL   HDL 76.70 >39.00 mg/dL   VLDL 23.4 0.0 - 40.0 mg/dL   LDL Cholesterol 167 (H) 0 - 99 mg/dL   Total CHOL/HDL Ratio 3    NonHDL 190.08   T3, free  Result Value Ref Range   T3, Free 3.3 2.3 -  4.2 pg/mL  T4, free  Result Value Ref Range   Free T4 0.68 0.60 - 1.60 ng/dL  TSH  Result Value Ref Range   TSH 10.10 (H) 0.35 - 4.50 uIU/mL  CBC with Differential/Platelet  Result Value Ref Range   WBC 6.7 4.0 - 10.5  K/uL   RBC 4.96 3.87 - 5.11 Mil/uL   Hemoglobin 15.3 (H) 12.0 - 15.0 g/dL   HCT 44.7 36.0 - 46.0 %   MCV 90.0 78.0 - 100.0 fl   MCHC 34.3 30.0 - 36.0 g/dL   RDW 13.3 11.5 - 15.5 %   Platelets 294.0 150.0 - 400.0 K/uL   Neutrophils Relative % 55.8 43.0 - 77.0 %   Lymphocytes Relative 31.4 12.0 - 46.0 %   Monocytes Relative 9.1 3.0 - 12.0 %   Eosinophils Relative 2.6 0.0 - 5.0 %   Basophils Relative 1.1 0.0 - 3.0 %   Neutro Abs 3.7 1.4 - 7.7 K/uL   Lymphs Abs 2.1 0.7 - 4.0 K/uL   Monocytes Absolute 0.6 0.1 - 1.0 K/uL   Eosinophils Absolute 0.2 0.0 - 0.7 K/uL   Basophils Absolute 0.1 0.0 - 0.1 K/uL  Hepatic function panel  Result Value Ref Range   Total Bilirubin 0.8 0.2 - 1.2 mg/dL   Bilirubin, Direct 0.1 0.0 - 0.3 mg/dL   Alkaline Phosphatase 62 39 - 117 U/L   AST 13 0 - 37 U/L   ALT 10 0 - 35 U/L   Total Protein 7.1 6.0 - 8.3 g/dL   Albumin 4.5 3.5 - 5.2 g/dL  Basic metabolic panel  Result Value Ref Range   Sodium 140 135 - 145 mEq/L   Potassium 4.3 3.5 - 5.1 mEq/L   Chloride 102 96 - 112 mEq/L   CO2 30 19 - 32 mEq/L   Glucose, Bld 101 (H) 70 - 99 mg/dL   BUN 13 6 - 23 mg/dL   Creatinine, Ser 0.64 0.40 - 1.20 mg/dL   GFR 87.79 >60.00 mL/min   Calcium 9.9 8.4 - 10.5 mg/dL    This visit occurred during the SARS-CoV-2 public health emergency.  Safety protocols were in place, including screening questions prior to the visit, additional usage of staff PPE, and extensive cleaning of exam room while observing appropriate contact time as indicated for disinfecting solutions.   COVID 19 screen:  No recent travel or known exposure to COVID19 The patient denies respiratory symptoms of COVID 19 at this time. The importance of social distancing was discussed  today.   Assessment and Plan    Problem List Items Addressed This Visit    Endometrial cancer ([place])     Followed active surveillence by Dr. Gwynn Burly.      Essential hypertension - Primary     Good control at home. Has measurements... on amlodipine 10 mg daily.      Hypercholesteremia     21 % risk of CVD in next 10 years... with recent cancer diagnosis she is not interested in medication for cholesterol at this time... info given for natural medications.  Encouraged exercise, weight loss, healthy eating habits.       Subclinical hypothyroidism    She feels much better off medication.      White coat syndrome with diagnosis of hypertension        Eliezer Lofts, MD

## 2020-09-27 NOTE — Assessment & Plan Note (Signed)
She feels much better off medication.

## 2020-09-27 NOTE — Assessment & Plan Note (Signed)
21 % risk of CVD in next 10 years... with recent cancer diagnosis she is not interested in medication for cholesterol at this time... info given for natural medications.  Encouraged exercise, weight loss, healthy eating habits.

## 2020-09-27 NOTE — Assessment & Plan Note (Signed)
Followed active surveillence by Dr. Gwynn Burly.

## 2020-09-27 NOTE — Assessment & Plan Note (Signed)
Good control at home. Has measurements... on amlodipine 10 mg daily.

## 2020-09-27 NOTE — Progress Notes (Signed)
[email or web address]

## 2020-09-27 NOTE — Patient Instructions (Signed)
It was nice to meet you today!   Here is some info I have gathered for you for a trusted medical source. Lipid Management With Diet, Uptodate Feb 20.2021, Marisa Hua and [place]  Although earlier, smaller trials suggested a benefit of garlic supplementation, a subsequent larger trial failed to demonstrate improvement in lipids with use of any of three different garlic preparations (raw, powdered, or aged).  Bergamot: Improvements in serum lipids have been reported in trials of patients with metabolic syndrome, nonalcoholic fatty liver disease and in hyperlipidemic patients resistant to statin treatment. However, high-quality data on the effects of bergamot are lacking.  Suggestions for you if you would like to try natural supplements to lower cholesterol.  1.Souble fiber : Psyllium In a meta-analysis of randomized trials of patients with both normal and elevated cholesterol levels, the addition of 10.2 g/day of psyllium lowered the LDL cholesterol by an average of 12.8 mg/dL   2. Omega 3s: Mixed results in studies. Given you triglycerides are normal I would not use this.  3.Red yeast rice ( 2.4 grams divided half in AM half in PM): Red yeast rice is a fermented rice product, most often taken as a supplement, which can improve serum cholesterol  via  method similar to prescription statins.  Red yeast rice supplements lowered total cholesterol (208 versus 251 mg/dL) and LDL cholesterol (135 versus 175 mg/dL) compared with placebo.  4. Plant sterol.. There are naturally occurring sterols and stanols in nuts, legumes, whole grains, fruits, vegetables, and plant oils. In addition, a number of manufactured products enriched with plant sterols and stanols are commercially available. The margarines containing these compounds (eg, Benecol and Take Control spreads) have been available the longest and are the most studied  In a trial of 150 patients with mild hypercholesterolemia,those consuming the  fortified margarine experienced a 10 to 14 percent decrease in total cholesterol and LDL cholesterol.  5.Green Tea Catechins: .n a year-long randomized trial of more than 900 healthy postmenopausal women, green tea catechin supplements (1315 mg catechins/day) reduced total cholesterol and LDL cholesterol, increased triglycerides, and had no effect on HDL cholesterol

## 2020-12-11 ENCOUNTER — Encounter: Payer: Self-pay | Admitting: Gynecologic Oncology

## 2020-12-11 ENCOUNTER — Other Ambulatory Visit: Payer: Self-pay

## 2020-12-11 ENCOUNTER — Inpatient Hospital Stay: Payer: Medicare PPO | Attending: Gynecologic Oncology | Admitting: Gynecologic Oncology

## 2020-12-11 VITALS — BP 152/81 | HR 120 | Temp 97.6°F | Resp 18 | Ht 64.0 in | Wt 170.0 lb

## 2020-12-11 DIAGNOSIS — Z90722 Acquired absence of ovaries, bilateral: Secondary | ICD-10-CM | POA: Diagnosis not present

## 2020-12-11 DIAGNOSIS — Z90710 Acquired absence of both cervix and uterus: Secondary | ICD-10-CM | POA: Insufficient documentation

## 2020-12-11 DIAGNOSIS — C541 Malignant neoplasm of endometrium: Secondary | ICD-10-CM | POA: Diagnosis not present

## 2020-12-11 NOTE — Patient Instructions (Signed)
Is great to see you!  Have a wonderful summer and a good time traveling to [place].  I will see you in 6 months for follow-up.  As always, if you develop any new symptoms, such as pelvic pain or vaginal bleeding, please call to come in and see me sooner.

## 2020-12-11 NOTE — Progress Notes (Signed)
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 ENDOMETRIUM, BIOPSY:  - Scant benign endocervical glandular type epithelium present in the  background of a majority of blood.  - No distinct endometrial tissue present for evaluation.    12/18/2019 Surgery   D&C: A. ENDOMETRIUM, CURETTAGE:  -  Benign squamous and endocervical glandular epithelium with  microglandular adenosis  -  Benign lower uterine segment  -  No malignancy identified  -  See comment    01/10/2020 Surgery   Hysteroscopic endometrial mass resection: A. ENDOMETRIUM, RESECTION:  - Complex atypical hyperplasia, at least partially involving an  endometrial polyp.   01/24/2020 Surgery   TRH/BSO, SLN injection, bilateral pelvic LND  On EUA, small mobile uterus. On itnra-abdominal entry, normal upper abdominal survey, stomach, small and large bowel, and omentum. Normal appearing bilateral adnexa. Uterus 8cm. Hemosiderin staining along left pelvis c/w history of endometriosis. Mapping to channels that met on the right external iliac artery - no clearly lymphatic tissue. Mapping along the broad ligament on the left without obvious SLN.  No intra-abdominal or pelvic evidence of disease.    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            Tumor invades more than one half of the myometrium.            Leiomyoma.            See oncology table.       Serosa: Unremarkable. No malignancy.  - Cervix: Benign squamous and endocervical mucosa. No dysplasia or  malignancy.  - Bilateral  ovaries: Benign serous cystadenoma. No malignancy.  - Bilateral fallopian tubes: Adhesions. No malignancy.   B. SOFT TISSUE, RIGHT EXTERNAL ILIAC, BIOPSY:  - Benign soft tissue.  - No lymphoid tissue identified.   C. LYMPH NODES, RIGHT PELVIC, REGIONAL SECTION:  - Six of six lymph nodes negative for malignancy (0/6).   D. LYMPH NODES, LEFT PELVIC, REGIONAL SECTION:  - Nine of nine lymph nodes negative for malignancy (0/9).    ONCOLOGY TABLE:   UTERUS, CARCINOMA OR CARCINOSARCOMA   Procedure: Total hysterectomy and bilateral salpingo-oophorectomy with  lymph node resections  Histologic type: Endometrioid adenocarcinoma  Histologic Grade: FIGO grade I  Myometrial invasion:       Depth of invasion: 8 mm       Myometrial thickness: 12 mm  Uterine Serosa Involvement: Not identified  Cervical stromal involvement: Not identified  Extent of involvement of other organs: Not identified  Lymphovascular invasion: Not identified  Regional Lymph Nodes:       Examined:       0 Sentinel                               15 non-sentinel                               15 total        Lymph nodes with metastasis: 0  Isolated tumor cells (<0.2 mm): 0        Micrometastasis:  (>0.2 mm and < 2.0 mm): 0        Macrometastasis: (>2.0 mm): 0        Extracapsular extension: Not applicable  Representative Tumor Block: A5  MMR / MSI testing: Will be ordered    01/24/2020 Pathologic Stage   Stage IB, grade 1 No LVSI, negative LNDs   01/24/2020 Cancer Staging   Staging form: Corpus Uteri - Carcinoma and Carcinosarcoma, AJCC 8th Edition - Clinical stage from 01/24/2020: FIGO Stage IB (cT1b, cN0, cM0) - Signed by Lafonda Mosses, MD on 01/31/2020     Interval History: The patient presents today for follow-up.  She is overall doing very well since her last visit.  She denies any vaginal bleeding or discharge.  She denies any pelvic or abdominal pain.  She endorses a good appetite without nausea  or emesis.  She reports normal bowel and bladder function.  She notes significant improvement in her left hip pain and really only feels symptoms if she sleeps on that side for more than about 6 hours.  She is continuing to walk 7000-10,000 steps a day.  She has been enjoying working some and doing a fair amount of gardening.  She traveled to [place] several months ago, and is traveling to [place] for several weeks this summer.  In the setting of her hypertension, the patient checks her blood pressure at home.  Yesterday, her blood pressure was 117/69.  Past Medical/Surgical History: Past Medical History:  Diagnosis Date  . Abnormal uterine bleeding (AUB)   . Anxiety    very anxious in a medical setting  . Cataract 2019   bilateral eyes; corrected with surgery  . Fall 12/2015   multiple falls in week   . Hypertension   . Hypothyroidism     Past Surgical History:  Procedure Laterality Date  . APPENDECTOMY  age 75  . CATARACT EXTRACTION W/ INTRAOCULAR LENS  IMPLANT, BILATERAL  2019  . DILATATION & CURETTAGE/HYSTEROSCOPY WITH MYOSURE N/A 01/10/2020   Procedure: Jacklynn Barnacle WITH ENDOMETRIAL SAMPLING;  Surgeon: Lafonda Mosses, MD;  Location[name];  Service: Gynecology;  Laterality: N/A;  . HYSTEROSCOPY WITH D & C N/A 12/18/2019   Procedure: DILATATION AND CURETTAGE /HYSTEROSCOPY;  Surgeon: Woodroe Mode, MD;  Location: [place];  Service: Gynecology;  Laterality: N/A;  . ROBOTIC ASSISTED TOTAL HYSTERECTOMY WITH BILATERAL SALPINGO OOPHERECTOMY N/A 01/24/2020   Procedure: XI ROBOTIC ASSISTED TOTAL HYSTERECTOMY WITH BILATERAL SALPINGO OOPHORECTOMY;  Surgeon: Lafonda Mosses, MD;  Location: [name];  Service: Gynecology;  Laterality: N/A;  . SENTINEL NODE BIOPSY N/A 01/24/2020   Procedure: SENTINEL NODE INJECTION BIOPSY, BILATERAL PELVIC LYMPH NODE DISSECTION;  Surgeon: Lafonda Mosses, MD;  Location: [place];   Service: Gynecology;  Laterality: N/A;  . TONSILLECTOMY  age 75    Family History  Problem Relation Age of Onset  . Breast cancer Neg Hx   . Ovarian cancer Neg Hx   . Endometrial cancer Neg Hx   . Colon cancer Neg Hx     Social History   Socioeconomic History  . Marital status: Single    Spouse name: Not on file  . Number of children: 0  . Years of education: 25  . Highest education level: Not on file  Occupational History  . Not on file  Tobacco Use  . Smoking status: Never Smoker  .  Smokeless tobacco: Never Used  Vaping Use  . Vaping Use: Never used  Substance and Sexual Activity  . Alcohol use: Never    Alcohol/week: 1.0 standard drink    Types: 1 Glasses of wine per week  . Drug use: Never  . Sexual activity: Not Currently  Other Topics Concern  . Not on file  Social History Narrative  . Not on file   Social Determinants of Health   Financial Resource Strain: Not on file  Food Insecurity: Not on file  Transportation Needs: Not on file  Physical Activity: Not on file  Stress: Not on file  Social Connections: Not on file    Current Medications:  Current Outpatient Medications:  .  amLODipine (NORVASC) 10 MG tablet, TAKE 1 TABLET(10 MG) BY MOUTH DAILY, Disp: 90 tablet, Rfl: 0  Review of Systems: Denies appetite changes, fevers, chills, fatigue, unexplained weight changes. Denies hearing loss, neck lumps or masses, mouth sores, ringing in ears or voice changes. Denies cough or wheezing.  Denies shortness of breath. Denies chest pain or palpitations. Denies leg swelling. Denies abdominal distention, pain, blood in stools, constipation, diarrhea, nausea, vomiting, or early satiety. Denies pain with intercourse, dysuria, frequency, hematuria or incontinence. Denies hot flashes, pelvic pain, vaginal bleeding or vaginal discharge.   Denies joint pain, back pain or muscle pain/cramps. Denies itching, rash, or wounds. Denies dizziness, headaches, numbness or  seizures. Denies swollen lymph nodes or glands, denies easy bruising or bleeding. Denies anxiety, depression, confusion, or decreased concentration.  Physical Exam: BP (!) 152/81 (BP Location: Left Arm, Patient Position: Sitting)   Pulse (!) 120   Temp 97.6 F (36.4 C) (Tympanic)   Resp 18   Ht $R'5\' 4"'Dp$  (1.626 m)   Wt 170 lb (77.1 kg)   SpO2 100%   BMI 29.18 kg/m  General: Alert, oriented, no acute distress. HEENT: Normocephalic, atraumatic, sclera anicteric. Chest: Clear to auscultation bilaterally.  No wheezes or rhonchi. Cardiovascular: Regular rate and rhythm, no murmurs. Abdomen: soft, nontender.  Normoactive bowel sounds.  No masses or hepatosplenomegaly appreciated.  Well-healed incisions.  Extremities: Grossly normal range of motion.  Warm, well perfused.  No edema bilaterally. Skin: No rashes or lesions noted. Lymphatics: No cervical, supraclavicular, or inguinal adenopathy. GU: Normal appearing external genitalia without erythema, excoriation, or lesions.  Speculum exam reveals moderately atrophic vaginal mucosa, no bleeding, discharge, or masses noted.  Cuff intact.  Bimanual exam reveals cuff intact, no masses or nodularity.  Rectovaginal exam confirms findings.  Laboratory & Radiologic Studies: None new  Assessment & Plan: Betty Sanchez is a 75 y.o. woman with Stage IBgrade 1 endometrioid endometrial adenocarcinomawho presents for surveillance visit.  Luvia continues to do very well and is NED on exam today.  We reviewed signs and symptoms that would be concerning for disease recurrence, and she knows to call if she develops any of these before her next scheduled visit.  Next month, she will be a year out from diagnosis.  We discussed transitioning to visits every 6 months (which we have already been doing) for at least another year.  When she is 2 years out from diagnosis, we can discuss staying at surveillance visits every 6 months versus yearly visits.  30 minutes of  total time was spent for this patient encounter, including preparation, face-to-face counseling with the patient and coordination of care, and documentation of the encounter.  Jeral Pinch, MD  Division of Gynecologic Oncology  Department of Obstetrics and Gynecology  [facility]

## 2020-12-14 ENCOUNTER — Other Ambulatory Visit: Payer: Self-pay | Admitting: Family Medicine

## 2020-12-14 DIAGNOSIS — I10 Essential (primary) hypertension: Secondary | ICD-10-CM

## 2021-04-30 ENCOUNTER — Ambulatory Visit: Payer: Medicare PPO

## 2021-05-02 ENCOUNTER — Encounter: Payer: Medicare PPO | Admitting: Family Medicine

## 2021-06-05 ENCOUNTER — Encounter: Payer: Self-pay | Admitting: Gynecologic Oncology

## 2021-06-06 ENCOUNTER — Encounter: Payer: Self-pay | Admitting: Gynecologic Oncology

## 2021-06-06 ENCOUNTER — Inpatient Hospital Stay: Payer: Medicare PPO | Attending: Gynecologic Oncology | Admitting: Gynecologic Oncology

## 2021-06-06 ENCOUNTER — Other Ambulatory Visit: Payer: Self-pay

## 2021-06-06 VITALS — BP 153/85 | HR 122 | Temp 97.3°F | Resp 18 | Ht 64.0 in | Wt 165.0 lb

## 2021-06-06 DIAGNOSIS — Z8542 Personal history of malignant neoplasm of other parts of uterus: Secondary | ICD-10-CM | POA: Diagnosis not present

## 2021-06-06 DIAGNOSIS — E039 Hypothyroidism, unspecified: Secondary | ICD-10-CM | POA: Diagnosis not present

## 2021-06-06 DIAGNOSIS — C541 Malignant neoplasm of endometrium: Secondary | ICD-10-CM

## 2021-06-06 DIAGNOSIS — I10 Essential (primary) hypertension: Secondary | ICD-10-CM | POA: Diagnosis not present

## 2021-06-06 DIAGNOSIS — Z79899 Other long term (current) drug therapy: Secondary | ICD-10-CM | POA: Diagnosis not present

## 2021-06-06 DIAGNOSIS — Z90722 Acquired absence of ovaries, bilateral: Secondary | ICD-10-CM | POA: Diagnosis not present

## 2021-06-06 DIAGNOSIS — Z90710 Acquired absence of both cervix and uterus: Secondary | ICD-10-CM | POA: Insufficient documentation

## 2021-06-06 NOTE — Patient Instructions (Signed)
It was great to see you today!  I do not see or feel any evidence of cancer.  I will see you back in 6 months.  As always, please call if you develop any new symptoms concerning for possible cancer recurrence.  Happy early birthday!

## 2021-06-06 NOTE — Progress Notes (Signed)
Gynecologic Oncology Return Clinic Visit  06/06/2021  Reason for Visit: Surveillance visit in the setting of early stage high intermediate risk uterine cancer  Treatment History: Oncology History Overview Note  IHC MMR normal MSI-stable   Endometrial cancer ([place])  10/26/2019 Initial Biopsy   EMB: A. ENDOMETRIUM, BIOPSY:  - Scant benign endocervical glandular type epithelium present in the  background of a majority of blood.  - No distinct endometrial tissue present for evaluation.    12/18/2019 Surgery   D&C: A. ENDOMETRIUM, CURETTAGE:  -  Benign squamous and endocervical glandular epithelium with  microglandular adenosis  -  Benign lower uterine segment  -  No malignancy identified  -  See comment    01/10/2020 Surgery   Hysteroscopic endometrial mass resection: A. ENDOMETRIUM, RESECTION:  - Complex atypical hyperplasia, at least partially involving an  endometrial polyp.   01/24/2020 Surgery   TRH/BSO, SLN injection, bilateral pelvic LND  On EUA, small mobile uterus. On itnra-abdominal entry, normal upper abdominal survey, stomach, small and large bowel, and omentum. Normal appearing bilateral adnexa. Uterus 8cm. Hemosiderin staining along left pelvis c/w history of endometriosis. Mapping to channels that met on the right external iliac artery - no clearly lymphatic tissue. Mapping along the broad ligament on the left without obvious SLN.  No intra-abdominal or pelvic evidence of disease.    01/24/2020 Initial Diagnosis   Endometrial cancer ([place])   01/24/2020 Pathology Results   UTERUS, CERVIX, BILATERAL FALLOPIAN TUBES AND OVARIES:  - Uterus:       Endomyometrium: Endometrioid adenocarcinoma, FIGO grade 1, spanning  2.7 cm.            Tumor invades more than one half of the myometrium.            Leiomyoma.            See oncology table.       Serosa: Unremarkable. No malignancy.  - Cervix: Benign squamous and endocervical mucosa. No dysplasia or  malignancy.  -  Bilateral ovaries: Benign serous cystadenoma. No malignancy.  - Bilateral fallopian tubes: Adhesions. No malignancy.   B. SOFT TISSUE, RIGHT EXTERNAL ILIAC, BIOPSY:  - Benign soft tissue.  - No lymphoid tissue identified.   C. LYMPH NODES, RIGHT PELVIC, REGIONAL SECTION:  - Six of six lymph nodes negative for malignancy (0/6).   D. LYMPH NODES, LEFT PELVIC, REGIONAL SECTION:  - Nine of nine lymph nodes negative for malignancy (0/9).    ONCOLOGY TABLE:   UTERUS, CARCINOMA OR CARCINOSARCOMA   Procedure: Total hysterectomy and bilateral salpingo-oophorectomy with  lymph node resections  Histologic type: Endometrioid adenocarcinoma  Histologic Grade: FIGO grade I  Myometrial invasion:       Depth of invasion: 8 mm       Myometrial thickness: 12 mm  Uterine Serosa Involvement: Not identified  Cervical stromal involvement: Not identified  Extent of involvement of other organs: Not identified  Lymphovascular invasion: Not identified  Regional Lymph Nodes:       Examined:       0 Sentinel                               15 non-sentinel                               15 total        Lymph nodes with metastasis: 0  Isolated tumor cells (<0.2 mm): 0        Micrometastasis:  (>0.2 mm and < 2.0 mm): 0        Macrometastasis: (>2.0 mm): 0        Extracapsular extension: Not applicable  Representative Tumor Block: A5  MMR / MSI testing: Will be ordered    01/24/2020 Pathologic Stage   Stage IB, grade 1 No LVSI, negative LNDs   01/24/2020 Cancer Staging   Staging form: Corpus Uteri - Carcinoma and Carcinosarcoma, AJCC 8th Edition - Clinical stage from 01/24/2020: FIGO Stage IB (cT1b, cN0, cM0) - Signed by Lafonda Mosses, MD on 01/31/2020      Interval History: Patient presents today for surveillance visit.  She notes overall doing well.  She denies any vaginal bleeding or discharge.  She denies pelvic or abdominal pain.  She endorses regular bowel and bladder function.  She  denies any nausea or emesis and reports a good appetite.  She continues to enjoy gardening and gardens year-round.  She is looking forward to big family celebration for Christmas and her birthday.  Past Medical/Surgical History: Past Medical History:  Diagnosis Date   Abnormal uterine bleeding (AUB)    Anxiety    very anxious in a medical setting   Cataract 2019   bilateral eyes; corrected with surgery   Fall 12/2015   multiple falls in week    Hypertension    Hypothyroidism     Past Surgical History:  Procedure Laterality Date   APPENDECTOMY  age 89   CATARACT EXTRACTION W/ [place], BILATERAL  2019   DILATATION & CURETTAGE/HYSTEROSCOPY WITH MYOSURE N/A 01/10/2020   Procedure: [place];  Surgeon: Lafonda Mosses, MD;  Location: [facility];  Service: Gynecology;  Laterality: N/A;   HYSTEROSCOPY WITH D & C N/A 12/18/2019   Procedure: DILATATION AND CURETTAGE /HYSTEROSCOPY;  Surgeon: Woodroe Mode, MD;  Location: [place];  Service: Gynecology;  Laterality: N/A;   ROBOTIC ASSISTED TOTAL HYSTERECTOMY WITH BILATERAL SALPINGO OOPHERECTOMY N/A 01/24/2020   Procedure: XI ROBOTIC ASSISTED TOTAL HYSTERECTOMY WITH BILATERAL SALPINGO OOPHORECTOMY;  Surgeon: Lafonda Mosses, MD;  Location: [facility];  Service: Gynecology;  Laterality: N/A;   SENTINEL NODE BIOPSY N/A 01/24/2020   Procedure: SENTINEL NODE INJECTION BIOPSY, BILATERAL PELVIC LYMPH NODE DISSECTION;  Surgeon: Lafonda Mosses, MD;  Location: [place];  Service: Gynecology;  Laterality: N/A;   TONSILLECTOMY  age 38    Family History  Problem Relation Age of Onset   Breast cancer Neg Hx    Ovarian cancer Neg Hx    Endometrial cancer Neg Hx    Colon cancer Neg Hx     Social History   Socioeconomic History   Marital status: Single    Spouse name: Not on file   Number of children: 0   Years of education: 17    Highest education level: Not on file  Occupational History   Not on file  Tobacco Use   Smoking status: Never   Smokeless tobacco: Never  Vaping Use   Vaping Use: Never used  Substance and Sexual Activity   Alcohol use: Never    Alcohol/week: 1.0 standard drink    Types: 1 Glasses of wine per week   Drug use: Never   Sexual activity: Not Currently  Other Topics Concern   Not on file  Social History Narrative   Not on file   Social  Determinants of Health   Financial Resource Strain: Not on file  Food Insecurity: Not on file  Transportation Needs: Not on file  Physical Activity: Not on file  Stress: Not on file  Social Connections: Not on file    Current Medications:  Current Outpatient Medications:    amLODipine (NORVASC) 10 MG tablet, TAKE 1 TABLET(10 MG) BY MOUTH DAILY, Disp: 90 tablet, Rfl: 1  Review of Systems: Denies appetite changes, fevers, chills, fatigue, unexplained weight changes. Denies hearing loss, neck lumps or masses, mouth sores, ringing in ears or voice changes. Denies cough or wheezing.  Denies shortness of breath. Denies chest pain or palpitations. Denies leg swelling. Denies abdominal distention, pain, blood in stools, constipation, diarrhea, nausea, vomiting, or early satiety. Denies pain with intercourse, dysuria, frequency, hematuria or incontinence. Denies hot flashes, pelvic pain, vaginal bleeding or vaginal discharge.   Denies joint pain, back pain or muscle pain/cramps. Denies itching, rash, or wounds. Denies dizziness, headaches, numbness or seizures. Denies swollen lymph nodes or glands, denies easy bruising or bleeding. Denies anxiety, depression, confusion, or decreased concentration.  Physical Exam: BP (!) 153/85 (BP Location: Right Arm, Patient Position: Sitting)   Pulse (!) 122   Temp (!) 97.3 F (36.3 C) (Tympanic)   Resp 18   Ht _0  (1.626 m)   Wt 165 lb (74.8 kg)   SpO2 100%   BMI 28.32 kg/m  General: Alert, oriented,  no acute distress. HEENT: Normocephalic, atraumatic, sclera anicteric. Chest: Clear to auscultation bilaterally.  No wheezes or rhonchi. Cardiovascular: Regular rate and rhythm, no murmurs. Abdomen: soft, nontender.  Normoactive bowel sounds.  No masses or hepatosplenomegaly appreciated.  Well-healed incisions. Extremities: Grossly normal range of motion.  Warm, well perfused.  No edema bilaterally. Skin: No rashes or lesions noted. Lymphatics: No cervical, supraclavicular, or inguinal adenopathy. GU: Normal appearing external genitalia without erythema, excoriation, or lesions. Speculum exam reveals moderately atrophic vaginal mucosa, no bleeding, discharge, or masses noted.  Cuff intact.  Bimanual exam reveals cuff intact, no masses or nodularity.  Rectovaginal exam confirms findings.  Laboratory & Radiologic Studies: None new  Assessment & Plan: Betty Sanchez is a 75 y.o. woman with  Stage IB grade 1 endometrioid endometrial adenocarcinoma who presents for surveillance visit. S/p definitive surgery 01/2020.   Betty Sanchez continues to do very well and is NED on exam today.  We reviewed signs and symptoms that would be concerning for disease recurrence, and she knows to call if she develops any of these before her next scheduled visit.     NCCN surveillance guidelines recommend visits every 6 months for 5 years.  At previous visits, she and I have discussed transitioning to visits every 6 months (which we have already been doing) for at least another year.  When she is 2 years out from diagnosis, we can discuss staying at surveillance visits every 6 months versus yearly visits.  32 minutes of total time was spent for this patient encounter, including preparation, face-to-face counseling with the patient and coordination of care, and documentation of the encounter.  Jeral Pinch, MD  Division of Gynecologic Oncology  Department of Obstetrics and Gynecology  [facility]

## 2021-06-15 ENCOUNTER — Other Ambulatory Visit: Payer: Self-pay | Admitting: Family Medicine

## 2021-06-15 DIAGNOSIS — I10 Essential (primary) hypertension: Secondary | ICD-10-CM

## 2021-07-20 ENCOUNTER — Encounter: Payer: Self-pay | Admitting: Emergency Medicine

## 2021-07-20 ENCOUNTER — Ambulatory Visit
Admission: EM | Admit: 2021-07-20 | Discharge: 2021-07-20 | Disposition: A | Discharge status: Home / Self Care | Payer: Medicare PPO | Attending: Emergency Medicine | Admitting: Emergency Medicine

## 2021-07-20 ENCOUNTER — Other Ambulatory Visit: Payer: Self-pay

## 2021-07-20 ENCOUNTER — Encounter: Payer: Self-pay | Admitting: Family Medicine

## 2021-07-20 DIAGNOSIS — U071 COVID-19: Secondary | ICD-10-CM

## 2021-07-20 MED ORDER — MOLNUPIRAVIR EUA 200MG CAPSULE: 4.0000 | ORAL_CAPSULE | Freq: Two times a day (BID) | ORAL | 0 refills | Status: AC

## 2021-07-20 NOTE — ED Provider Notes (Signed)
Roderic Palau    CSN: [id number] Arrival date & time: 07/20/21  0931      History   Chief Complaint Chief Complaint  Patient presents with   Nasal Congestion   Headache   Covid Positive    HPI Courtenay Hirth is a 76 y.o. female.  Patient presents with nasal congestion, runny nose, headache since yesterday.  She tested positive for COVID at home last night.  Treatment at home with Tylenol.  No fever, chills, rash, sore throat, cough, shortness of breath, vomiting, diarrhea, or other symptoms.  Her medical history includes hypertension, endometrial cancer, hypothyroidism.  The history is provided by the patient and medical records.   Past Medical History:  Diagnosis Date   Abnormal uterine bleeding (AUB)    Anxiety    very anxious in a medical setting   Cataract 2019   bilateral eyes; corrected with surgery   Fall 12/2015   multiple falls in week    Hypertension    Hypothyroidism     Patient Active Problem List   Diagnosis Date Noted   Subclinical hypothyroidism 09/27/2020   Hypercholesteremia 09/27/2020   White coat syndrome with diagnosis of hypertension 09/27/2020   Endometrial cancer ([place])    Essential hypertension 09/08/2018    Past Surgical History:  Procedure Laterality Date   APPENDECTOMY  age 6   CATARACT EXTRACTION W/ INTRAOCULAR LENS  IMPLANT, BILATERAL  2019   DILATATION & CURETTAGE/HYSTEROSCOPY WITH MYOSURE N/A 01/10/2020   Procedure: [place];  Surgeon: Lafonda Mosses, MD;  Location: [facility];  Service: Gynecology;  Laterality: N/A;   HYSTEROSCOPY WITH D & C N/A 12/18/2019   Procedure: DILATATION AND CURETTAGE /HYSTEROSCOPY;  Surgeon: Woodroe Mode, MD;  Location: [facility];  Service: Gynecology;  Laterality: N/A;   ROBOTIC ASSISTED TOTAL HYSTERECTOMY WITH BILATERAL SALPINGO OOPHERECTOMY N/A 01/24/2020   Procedure: XI ROBOTIC ASSISTED TOTAL HYSTERECTOMY WITH BILATERAL SALPINGO  OOPHORECTOMY;  Surgeon: Lafonda Mosses, MD;  Location: [facility];  Service: Gynecology;  Laterality: N/A;   SENTINEL NODE BIOPSY N/A 01/24/2020   Procedure: SENTINEL NODE INJECTION BIOPSY, BILATERAL PELVIC LYMPH NODE DISSECTION;  Surgeon: Lafonda Mosses, MD;  Location: [place];  Service: Gynecology;  Laterality: N/A;   TONSILLECTOMY  age 65    OB History     Gravida  0   Para  0   Term  0   Preterm  0   AB  0   Living  0      SAB  0   IAB  0   Ectopic  0   Multiple  0   Live Births  0            Home Medications    Prior to Admission medications   Medication Sig Start Date End Date Taking? Authorizing Provider  molnupiravir EUA (LAGEVRIO) 200 mg CAPS capsule Take 4 capsules (800 mg total) by mouth 2 (two) times daily for 5 days. 07/20/21 07/25/21 Yes Sharion Balloon, NP  amLODipine (NORVASC) 10 MG tablet TAKE 1 TABLET(10 MG) BY MOUTH DAILY 06/15/21   Jinny Sanders, MD    Family History Family History  Problem Relation Age of Onset   Breast cancer Neg Hx    Ovarian cancer Neg Hx    Endometrial cancer Neg Hx    Colon cancer Neg Hx     Social History Social History   Tobacco Use   Smoking status:  Never   Smokeless tobacco: Never  Vaping Use   Vaping Use: Never used  Substance Use Topics   Alcohol use: Never    Alcohol/week: 1.0 standard drink    Types: 1 Glasses of wine per week   Drug use: Never     Allergies   Patient has no known allergies.   Review of Systems Review of Systems  Constitutional:  Negative for chills and fever.  HENT:  Positive for congestion and rhinorrhea. Negative for ear pain and sore throat.   Respiratory:  Negative for cough and shortness of breath.   Cardiovascular:  Negative for chest pain and palpitations.  Gastrointestinal:  Negative for diarrhea and vomiting.  Skin:  Negative for color change and rash.  Neurological:  Positive for headaches.  All other systems  reviewed and are negative.   Physical Exam Triage Vital Signs ED Triage Vitals  Enc Vitals Group     BP 07/20/21 0943 125/87     Pulse Rate 07/20/21 0943 (!) 124     Resp 07/20/21 0943 18     Temp 07/20/21 0943 98.2 F (36.8 C)     Temp src --      SpO2 07/20/21 0943 98 %     Weight --      Height --      Head Circumference --      Peak Flow --      Pain Score 07/20/21 0952 0     Pain Loc --      Pain Edu? --      Excl. in [place]? --    No data found.  Updated Vital Signs BP 125/87 (BP Location: Left Arm)    Pulse (!) 124    Temp 98.2 F (36.8 C)    Resp 18    SpO2 98%   Visual Acuity Right Eye Distance:   Left Eye Distance:   Bilateral Distance:    Right Eye Near:   Left Eye Near:    Bilateral Near:     Physical Exam Vitals and nursing note reviewed.  Constitutional:      General: She is not in acute distress.    Appearance: She is well-developed. She is not ill-appearing.  HENT:     Right Ear: Tympanic membrane normal.     Left Ear: Tympanic membrane normal.     Nose: Nose normal.     Mouth/Throat:     Mouth: Mucous membranes are moist.     Pharynx: Oropharynx is clear.  Eyes:     Conjunctiva/sclera: Conjunctivae normal.  Cardiovascular:     Rate and Rhythm: Normal rate and regular rhythm.     Heart sounds: Normal heart sounds.  Pulmonary:     Effort: Pulmonary effort is normal. No respiratory distress.     Breath sounds: Normal breath sounds.  Musculoskeletal:     Cervical back: Neck supple.  Skin:    General: Skin is warm and dry.  Neurological:     Mental Status: She is alert.  Psychiatric:        Mood and Affect: Mood normal.        Behavior: Behavior normal.     UC Treatments / Results  Labs (all labs ordered are listed, but only abnormal results are displayed) Labs Reviewed - No data to display  EKG   Radiology No results found.  Procedures Procedures (including critical care time)  Medications Ordered in UC Medications - No  data to display  Initial Impression /  Assessment and Plan / UC Course  I have reviewed the triage vital signs and the nursing notes.  Pertinent labs & imaging results that were available during my care of the patient were reviewed by me and considered in my medical decision making (see chart for details).    COVID-19.  Symptom onset yesterday. Tested positive at home yesterday.  Patient declines PCR test.  Discussed treatment options.  Treating with molnupiravir.  Discussed that this is an emergency authorized medication for treatment of COVID.  Discussed side effects including nausea, diarrhea, dizziness.  Discussed other symptomatic treatment including Tylenol, rest, hydration.  Instructed patient to follow-up with PCP if symptoms are not improving.  ED precautions discussed.  Patient agrees to plan of care.   Final Clinical Impressions(s) / UC Diagnoses   Final diagnoses:  [id number]     Discharge Instructions      Take the molnupiravir as directed.    You should self-quarantine according to the CDC guidelines.  See attached.    Most people do not need to be re-tested at the end of the quarantine period.    Go to the emergency department if you have high fever, shortness of breath, severe diarrhea, or other concerning symptoms.        ED Prescriptions     Medication Sig Dispense Auth. Provider   molnupiravir EUA (LAGEVRIO) 200 mg CAPS capsule Take 4 capsules (800 mg total) by mouth 2 (two) times daily for 5 days. 40 capsule Sharion Balloon, NP      PDMP not reviewed this encounter.   Sharion Balloon, NP 07/20/21 1031

## 2021-07-20 NOTE — Discharge Instructions (Addendum)
Take the molnupiravir as directed.    You should self-quarantine according to the CDC guidelines.  See attached.    Most people do not need to be re-tested at the end of the quarantine period.    Go to the emergency department if you have high fever, shortness of breath, severe diarrhea, or other concerning symptoms.

## 2021-07-20 NOTE — ED Triage Notes (Signed)
Pt here with congestion and headache since yesterday. Tested positive for COVID yesterday evening and had a direct exposure to family member with the virus about 5 days ago.

## 2021-08-27 ENCOUNTER — Telehealth: Payer: Self-pay | Admitting: Family Medicine

## 2021-08-27 DIAGNOSIS — E7800 Pure hypercholesterolemia, unspecified: Secondary | ICD-10-CM

## 2021-08-27 DIAGNOSIS — E039 Hypothyroidism, unspecified: Secondary | ICD-10-CM

## 2021-08-27 NOTE — Telephone Encounter (Signed)
-----   Message from Ellamae Sia sent at 08/25/2021  4:23 PM EST ----- Regarding: lab orders for Friday, 3.3.23 Patient is scheduled for CPX labs, please order future labs, Thanks , Karna Christmas

## 2021-09-04 NOTE — Progress Notes (Signed)
Subjective:   Betty Sanchez is a 76 y.o. female who presents for Medicare Annual (Subsequent) preventive examination.  I connected with Jasani Hemm today by telephone and verified that I am speaking with the correct person using two identifiers. Location patient: home Location provider: work Persons participating in the virtual visit: patient, Marine scientist.    I discussed the limitations, risks, security and privacy concerns of performing an evaluation and management service by telephone and the availability of in person appointments. I also discussed with the patient that there may be a patient responsible charge related to this service. The patient expressed understanding and verbally consented to this telephonic visit.    Interactive audio and video telecommunications were attempted between this provider and patient, however failed, due to patient having technical difficulties OR patient did not have access to video capability.  We continued and completed visit with audio only.  Some vital signs may be absent or patient reported.   Time Spent with patient on telephone encounter: 20 minutes  Review of Systems     Cardiac Risk Factors include: advanced age (>52men, >52 women);hypertension     Objective:    Today's Vitals   09/05/21 1155  Weight: 168 lb (76.2 kg)  Height: 5\' 4"  (1.626 m)   Body mass index is 28.84 kg/m.  Advanced Directives 09/05/2021 06/05/2021 05/21/2020 01/24/2020 01/10/2020 12/18/2019 12/07/2019  Does Patient Have a Medical Advance Directive? Yes Yes Yes Yes Yes Yes Yes  Type of Paramedic of [place];Living will [place];Living will [place];Living will [place];Living will [place];Living will Living will;Healthcare Power of [place]  Does patient want to make changes to medical advance directive? Yes (MAU/Ambulatory/Procedural Areas -  Information given) No - Patient declined - - - - No - Patient declined  Copy of [place] in Chart? - - (No Data) No - copy requested Yes - validated most recent copy scanned in chart (See row information) - No - copy requested  Would patient like information on creating a medical advance directive? - - - - - - -    Current Medications (verified) Outpatient Encounter Medications as of 09/05/2021  Medication Sig   amLODipine (NORVASC) 10 MG tablet TAKE 1 TABLET(10 MG) BY MOUTH DAILY   No facility-administered encounter medications on file as of 09/05/2021.    Allergies (verified) Patient has no known allergies.   History: Past Medical History:  Diagnosis Date   Abnormal uterine bleeding (AUB)    Anxiety    very anxious in a medical setting   Cataract 2019   bilateral eyes; corrected with surgery   Fall 12/2015   multiple falls in week    Hypertension    Hypothyroidism    Past Surgical History:  Procedure Laterality Date   APPENDECTOMY  age 69   CATARACT EXTRACTION W/ [place], BILATERAL  2019   DILATATION & CURETTAGE/HYSTEROSCOPY WITH MYOSURE N/A 01/10/2020   Procedure: [place];  Surgeon: Lafonda Mosses, MD;  Location: [facility];  Service: Gynecology;  Laterality: N/A;   HYSTEROSCOPY WITH D & C N/A 12/18/2019   Procedure: DILATATION AND CURETTAGE /HYSTEROSCOPY;  Surgeon: Woodroe Mode, MD;  Location: [place];  Service: Gynecology;  Laterality: N/A;   ROBOTIC ASSISTED TOTAL HYSTERECTOMY WITH BILATERAL SALPINGO OOPHERECTOMY N/A 01/24/2020   Procedure: XI ROBOTIC ASSISTED TOTAL HYSTERECTOMY WITH BILATERAL SALPINGO OOPHORECTOMY;  Surgeon:  Lafonda Mosses, MD;  Location: [facility];  Service: Gynecology;  Laterality: N/A;   SENTINEL NODE BIOPSY N/A 01/24/2020   Procedure: SENTINEL NODE INJECTION BIOPSY, BILATERAL PELVIC LYMPH NODE DISSECTION;  Surgeon: Lafonda Mosses, MD;  Location: [place];  Service: Gynecology;  Laterality: N/A;   TONSILLECTOMY  age 30   Family History  Problem Relation Age of Onset   Breast cancer Neg Hx    Ovarian cancer Neg Hx    Endometrial cancer Neg Hx    Colon cancer Neg Hx    Social History   Socioeconomic History   Marital status: Single    Spouse name: Not on file   Number of children: 0   Years of education: 57   Highest education level: Not on file  Occupational History   Not on file  Tobacco Use   Smoking status: Never   Smokeless tobacco: Never  Vaping Use   Vaping Use: Never used  Substance and Sexual Activity   Alcohol use: Never    Alcohol/week: 1.0 standard drink    Types: 1 Glasses of wine per week   Drug use: Never   Sexual activity: Not Currently  Other Topics Concern   Not on file  Social History Narrative   Not on file   Social Determinants of Health   Financial Resource Strain: Low Risk    Difficulty of Paying Living Expenses: Not hard at all  Food Insecurity: No Food Insecurity   Worried About Charity fundraiser in the Last Year: Never true   Ran Out of Food in the Last Year: Never true  Transportation Needs: No Transportation Needs   Lack of Transportation (Medical): No   Lack of Transportation (Non-Medical): No  Physical Activity: Sufficiently Active   Days of Exercise per Week: 7 days   Minutes of Exercise per Session: 50 min  Stress: No Stress Concern Present   Feeling of Stress : Only a little  Social Connections: Moderately Integrated   Frequency of Communication with Friends and Family: More than three times a week   Frequency of Social Gatherings with Friends and Family: More than three times a week   Attends Religious Services: More than 4 times per year   Active Member of [employer] or Organizations: Yes   Attends Music therapist: More than 4 times per year   Marital Status: Never married    Tobacco Counseling Counseling given: Not  Answered   Clinical Intake:  Pre-visit preparation completed: Yes  Pain : No/denies pain     BMI - recorded: 28.84 Nutritional Status: BMI > 30  Obese Nutritional Risks: None Diabetes: No  How often do you need to have someone help you when you read instructions, pamphlets, or other written materials from your doctor or pharmacy?: 1 - Never  Diabetic? No  Interpreter Needed?: No  Information entered by :: Orrin Brigham LPN   Activities of Daily Living In your present state of health, do you have any difficulty performing the following activities: 09/05/2021  Hearing? N  Vision? N  Difficulty concentrating or making decisions? N  Walking or climbing stairs? N  Dressing or bathing? N  Doing errands, shopping? N  Preparing Food and eating ? N  Using the Toilet? N  In the past six months, have you accidently leaked urine? N  Do you have problems with loss of bowel control? N  Managing your Medications? N  Managing your Finances? N  Housekeeping  or managing your Housekeeping? N  Some recent data might be hidden    Patient Care Team: Jinny Sanders, MD as PCP - General (Family Medicine) Lafonda Mosses, MD as Consulting Physician (Gynecologic Oncology) Dorothyann Gibbs, NP as Nurse Practitioner (Gynecologic Oncology)  Indicate any recent Medical Services you may have received from other than [facility] providers in the past year (date may be approximate).     Assessment:   This is a routine wellness examination for [place].  Hearing/Vision screen Hearing Screening - Comments:: No issues  Vision Screening - Comments:: Last exam 2022, Dr. Gloriann Loan   Dietary issues and exercise activities discussed: Current Exercise Habits: Home exercise routine, Type of exercise: walking;strength training/weights, Time (Minutes): 45, Frequency (Times/Week): 7, Weekly Exercise (Minutes/Week): 315, Intensity: Moderate   Goals Addressed             This Visit's Progress    Patient  Stated       Would like to maintain current routine       Depression Screen PHQ 2/9 Scores 09/05/2021 11/22/2019 10/26/2019 09/12/2019 09/02/2018 12/27/2017  PHQ - 2 Score 0 0 0 0 0 0  PHQ- 9 Score - 0 0 0 0 -    Fall Risk Fall Risk  09/05/2021 09/12/2019 09/02/2018 12/27/2017  Falls in the past year? 0 0 0 No  Number falls in past yr: 0 0 - -  Injury with Fall? 0 0 - -  Risk for fall due to : No Fall Risks Medication side effect - -  Follow up Falls prevention discussed Falls evaluation completed;Falls prevention discussed - -    FALL RISK PREVENTION PERTAINING TO THE HOME:  Any stairs in or around the home? Yes  If so, are there any without handrails? No  Home free of loose throw rugs in walkways, pet beds, electrical cords, etc? Yes  Adequate lighting in your home to reduce risk of falls? Yes   ASSISTIVE DEVICES UTILIZED TO PREVENT FALLS:  Life alert? No  Use of a cane, walker or w/c? No  Grab bars in the bathroom? Yes  Shower chair or bench in shower? Yes  Elevated toilet seat or a handicapped toilet? Yes   TIMED UP AND GO:  Was the test performed? No .    Cognitive Function: Normal cognitive status assessed by this Nurse Health Advisor. No abnormalities found.   MMSE - Mini Mental State Exam 09/12/2019 09/02/2018  Orientation to time 5 5  Orientation to Place 5 5  Registration 3 3  Attention/ Calculation 5 0  Recall 3 3  Language- name 2 objects - 0  Language- repeat 1 1  Language- follow 3 step command - 3  Language- read & follow direction - 0  Write a sentence - 0  Copy design - 0  Total score - 20        Immunizations Immunization History  Administered Date(s) Administered   PFIZER(Purple Top)SARS-COV-2 Vaccination 08/11/2019, 09/05/2019, 03/31/2020    TDAP status: Due, Education has been provided regarding the importance of this vaccine. Advised may receive this vaccine at local pharmacy or Health Dept. Aware to provide a copy of the vaccination record if  obtained from local pharmacy or Health Dept. Verbalized acceptance and understanding.  Flu Vaccine status: Declined, Education has been provided regarding the importance of this vaccine but patient still declined. Advised may receive this vaccine at local pharmacy or Health Dept. Aware to provide a copy of the vaccination record if obtained from local  pharmacy or Health Dept. Verbalized acceptance and understanding.  Pneumococcal vaccine status: Declined,  Education has been provided regarding the importance of this vaccine but patient still declined. Advised may receive this vaccine at local pharmacy or Health Dept. Aware to provide a copy of the vaccination record if obtained from local pharmacy or Health Dept. Verbalized acceptance and understanding.   Covid-19 vaccine status: Declined, Education has been provided regarding the importance of this vaccine but patient still declined. Advised may receive this vaccine at local pharmacy or Health Dept.or vaccine clinic. Aware to provide a copy of the vaccination record if obtained from local pharmacy or Health Dept. Verbalized acceptance and understanding.  Qualifies for Shingles Vaccine? Yes   Zostavax completed No   Shingrix Completed?: No.    Education has been provided regarding the importance of this vaccine. Patient has been advised to call insurance company to determine out of pocket expense if they have not yet received this vaccine. Advised may also receive vaccine at local pharmacy or Health Dept. Verbalized acceptance and understanding.  Screening Tests Health Maintenance  Topic Date Due   Zoster Vaccines- Shingrix (1 of 2) Never done   COLONOSCOPY (Pts 45-23yrs Insurance coverage will need to be confirmed)  Never done   MAMMOGRAM  Never done   Pneumonia Vaccine 21+ Years old (1 - PCV) Never done   DEXA SCAN  Never done   COVID-19 Vaccine (4 - Booster for Pfizer series) 05/26/2020   INFLUENZA VACCINE  Never done   TETANUS/TDAP   09/27/2021 (Originally 06/29/1965)   Hepatitis C Screening  Completed   HPV VACCINES  Aged Out    Health Maintenance  Health Maintenance Due  Topic Date Due   Zoster Vaccines- Shingrix (1 of 2) Never done   COLONOSCOPY (Pts 45-5yrs Insurance coverage will need to be confirmed)  Never done   MAMMOGRAM  Never done   Pneumonia Vaccine 46+ Years old (1 - PCV) Never done   DEXA SCAN  Never done   COVID-19 Vaccine (4 - Booster for [place] series) 05/26/2020   INFLUENZA VACCINE  Never done    Colorectal cancer screening: patient plans to discuss with PCP when decided   Mammogram status: patient plans to discuss with PCP when decided   Bone Density status: patient plans to discuss with PCP when decided   Lung Cancer Screening: (Low Dose CT Chest recommended if Age 69-80 years, 30 pack-year currently smoking OR have quit w/in 15years.) does not qualify.     Additional Screening:  Hepatitis C Screening: does qualify  Vision Screening: Recommended annual ophthalmology exams for early detection of glaucoma and other disorders of the eye. Is the patient up to date with their annual eye exam?  Yes  Who is the provider or what is the name of the office in which the patient attends annual eye exams? Dr. Gloriann Loan   Dental Screening: Recommended annual dental exams for proper oral hygiene  Community Resource Referral / Chronic Care Management: CRR required this visit?  No   CCM required this visit?  No      Plan:     I have personally reviewed and noted the following in the patients chart:   Medical and social history Use of alcohol, tobacco or illicit drugs  Current medications and supplements including opioid prescriptions.  Functional ability and status Nutritional status Physical activity Advanced directives List of other physicians Hospitalizations, surgeries, and ER visits in previous 12 months Vitals Screenings to include cognitive, depression, and falls  Referrals and  appointments  In addition, I have reviewed and discussed with patient certain preventive protocols, quality metrics, and best practice recommendations. A written personalized care plan for preventive services as well as general preventive health recommendations were provided to patient.   Due to this being a telephonic visit, the after visit summary with patients personalized plan was offered to patient via mail or my-chart.  Patient would like to access on my-chart.  Loma Messing, LPN   12/08/7844   Nurse Health Advisor  Nurse Notes: none

## 2021-09-05 ENCOUNTER — Other Ambulatory Visit (INDEPENDENT_AMBULATORY_CARE_PROVIDER_SITE_OTHER): Payer: Medicare PPO

## 2021-09-05 ENCOUNTER — Other Ambulatory Visit: Payer: Self-pay

## 2021-09-05 ENCOUNTER — Ambulatory Visit (INDEPENDENT_AMBULATORY_CARE_PROVIDER_SITE_OTHER): Payer: Medicare PPO

## 2021-09-05 VITALS — Ht 64.0 in | Wt 168.0 lb

## 2021-09-05 DIAGNOSIS — E7800 Pure hypercholesterolemia, unspecified: Secondary | ICD-10-CM | POA: Diagnosis not present

## 2021-09-05 DIAGNOSIS — Z0000 Encounter for general adult medical examination without abnormal findings: Secondary | ICD-10-CM

## 2021-09-05 DIAGNOSIS — E039 Hypothyroidism, unspecified: Secondary | ICD-10-CM

## 2021-09-05 LAB — LIPID PANEL
Cholesterol: 245 mg/dL — ABNORMAL HIGH (ref 0–200)
HDL: 68.6 mg/dL (ref >39.00)
LDL Cholesterol: 155 mg/dL — ABNORMAL HIGH (ref 0–99)
NonHDL: 176.57
Total CHOL/HDL Ratio: 4
Triglycerides: 107 mg/dL (ref 0.0–149.0)
VLDL: 21.4 mg/dL (ref 0.0–40.0)

## 2021-09-05 LAB — COMPREHENSIVE METABOLIC PANEL
ALT: 10 U/L (ref 0–35)
AST: 13 U/L (ref 0–37)
Albumin: 4.3 g/dL (ref 3.5–5.2)
Alkaline Phosphatase: 59 U/L (ref 39–117)
BUN: 12 mg/dL (ref 6–23)
CO2: 25 mEq/L (ref 19–32)
Calcium: 9.6 mg/dL (ref 8.4–10.5)
Chloride: 105 mEq/L (ref 96–112)
Creatinine, Ser: 0.63 mg/dL (ref 0.40–1.20)
GFR: 87.53 mL/min (ref >60.00)
Glucose, Bld: 95 mg/dL (ref 70–99)
Potassium: 4.3 mEq/L (ref 3.5–5.1)
Sodium: 140 mEq/L (ref 135–145)
Total Bilirubin: 0.6 mg/dL (ref 0.2–1.2)
Total Protein: 7.2 g/dL (ref 6.0–8.3)

## 2021-09-05 LAB — T3, FREE: T3, Free: 3.4 pg/mL (ref 2.3–4.2)

## 2021-09-05 LAB — TSH: TSH: 9 u[IU]/mL — ABNORMAL HIGH (ref 0.35–5.50)

## 2021-09-05 LAB — T4, FREE: Free T4: 0.7 ng/dL (ref 0.60–1.60)

## 2021-09-05 NOTE — Patient Instructions (Signed)
Ms. Strauch , Thank you for taking time to complete your Medicare Wellness Visit. I appreciate your ongoing commitment to your health goals. Please review the following plan we discussed and let me know if I can assist you in the future.   Screening recommendations/referrals: Colonoscopy: Discuss with PCP if you change your mind  Mammogram: Discuss with PCP if you change your mind  Bone Density: Discuss with PCP if you change your mind  Recommended yearly ophthalmology/optometry visit for glaucoma screening and checkup Recommended yearly dental visit for hygiene and checkup  Vaccinations: Influenza vaccine: Discuss with PCP if you change your mind  Pneumococcal vaccine: Discuss with PCP if you change your mind  Tdap vaccine: Discuss with PCP if you change your mind  Shingles vaccine: Discuss with PCP if you change your mind    Covid-19:newest booster available at your local pharmacy   Advanced directives: Please bring a copy of Living Will and/or Healthcare Power of Attorney for your chart.   Conditions/risks identified: see problem list   Next appointment: Follow up in one year for your annual wellness visit    Preventive Care 65 Years and Older, Female Preventive care refers to lifestyle choices and visits with your health care provider that can promote health and wellness. What does preventive care include? A yearly physical exam. This is also called an annual well check. Dental exams once or twice a year. Routine eye exams. Ask your health care provider how often you should have your eyes checked. Personal lifestyle choices, including: Daily care of your teeth and gums. Regular physical activity. Eating a healthy diet. Avoiding tobacco and drug use. Limiting alcohol use. Practicing safe sex. Taking low-dose aspirin every day. Taking vitamin and mineral supplements as recommended by your health care provider. What happens during an annual well check? The services and  screenings done by your health care provider during your annual well check will depend on your age, overall health, lifestyle risk factors, and family history of disease. Counseling  Your health care provider may ask you questions about your: Alcohol use. Tobacco use. Drug use. Emotional well-being. Home and relationship well-being. Sexual activity. Eating habits. History of falls. Memory and ability to understand (cognition). Work and work Statistician. Reproductive health. Screening  You may have the following tests or measurements: Height, weight, and BMI. Blood pressure. Lipid and cholesterol levels. These may be checked every 5 years, or more frequently if you are over 84 years old. Skin check. Lung cancer screening. You may have this screening every year starting at age 55 if you have a 30-pack-year history of smoking and currently smoke or have quit within the past 15 years. Fecal occult blood test (FOBT) of the stool. You may have this test every year starting at age 11. Flexible sigmoidoscopy or colonoscopy. You may have a sigmoidoscopy every 5 years or a colonoscopy every 10 years starting at age 26. Hepatitis C blood test. Hepatitis B blood test. Sexually transmitted disease (STD) testing. Diabetes screening. This is done by checking your blood sugar (glucose) after you have not eaten for a while (fasting). You may have this done every 1-3 years. Bone density scan. This is done to screen for osteoporosis. You may have this done starting at age 30. Mammogram. This may be done every 1-2 years. Talk to your health care provider about how often you should have regular mammograms. Talk with your health care provider about your test results, treatment options, and if necessary, the need for more tests.  Vaccines  Your health care provider may recommend certain vaccines, such as: Influenza vaccine. This is recommended every year. Tetanus, diphtheria, and acellular pertussis (Tdap,  Td) vaccine. You may need a Td booster every 10 years. Zoster vaccine. You may need this after age 85. Pneumococcal 13-valent conjugate (PCV13) vaccine. One dose is recommended after age 7. Pneumococcal polysaccharide (PPSV23) vaccine. One dose is recommended after age 69. Talk to your health care provider about which screenings and vaccines you need and how often you need them. This information is not intended to replace advice given to you by your health care provider. Make sure you discuss any questions you have with your health care provider. Document Released: 07/19/2015 Document Revised: 03/11/2016 Document Reviewed: 04/23/2015 Elsevier Interactive Patient Education  2017 [place] Prevention in the Home Falls can cause injuries. They can happen to people of all ages. There are many things you can do to make your home safe and to help prevent falls. What can I do on the outside of my home? Regularly fix the edges of walkways and driveways and fix any cracks. Remove anything that might make you trip as you walk through a door, such as a raised step or threshold. Trim any bushes or trees on the path to your home. Use bright outdoor lighting. Clear any walking paths of anything that might make someone trip, such as rocks or tools. Regularly check to see if handrails are loose or broken. Make sure that both sides of any steps have handrails. Any raised decks and porches should have guardrails on the edges. Have any leaves, snow, or ice cleared regularly. Use sand or salt on walking paths during winter. Clean up any spills in your garage right away. This includes oil or grease spills. What can I do in the bathroom? Use night lights. Install grab bars by the toilet and in the tub and shower. Do not use towel bars as grab bars. Use non-skid mats or decals in the tub or shower. If you need to sit down in the shower, use a plastic, non-slip stool. Keep the floor dry. Clean up any  water that spills on the floor as soon as it happens. Remove soap buildup in the tub or shower regularly. Attach bath mats securely with double-sided non-slip rug tape. Do not have throw rugs and other things on the floor that can make you trip. What can I do in the bedroom? Use night lights. Make sure that you have a light by your bed that is easy to reach. Do not use any sheets or blankets that are too big for your bed. They should not hang down onto the floor. Have a firm chair that has side arms. You can use this for support while you get dressed. Do not have throw rugs and other things on the floor that can make you trip. What can I do in the kitchen? Clean up any spills right away. Avoid walking on wet floors. Keep items that you use a lot in easy-to-reach places. If you need to reach something above you, use a strong step stool that has a grab bar. Keep electrical cords out of the way. Do not use floor polish or wax that makes floors slippery. If you must use wax, use non-skid floor wax. Do not have throw rugs and other things on the floor that can make you trip. What can I do with my stairs? Do not leave any items on the stairs. Make sure that  there are handrails on both sides of the stairs and use them. Fix handrails that are broken or loose. Make sure that handrails are as long as the stairways. Check any carpeting to make sure that it is firmly attached to the stairs. Fix any carpet that is loose or worn. Avoid having throw rugs at the top or bottom of the stairs. If you do have throw rugs, attach them to the floor with carpet tape. Make sure that you have a light switch at the top of the stairs and the bottom of the stairs. If you do not have them, ask someone to add them for you. What else can I do to help prevent falls? Wear shoes that: Do not have high heels. Have rubber bottoms. Are comfortable and fit you well. Are closed at the toe. Do not wear sandals. If you use a  stepladder: Make sure that it is fully opened. Do not climb a closed stepladder. Make sure that both sides of the stepladder are locked into place. Ask someone to hold it for you, if possible. Clearly mark and make sure that you can see: Any grab bars or handrails. First and last steps. Where the edge of each step is. Use tools that help you move around (mobility aids) if they are needed. These include: Canes. Walkers. Scooters. Crutches. Turn on the lights when you go into a dark area. Replace any light bulbs as soon as they burn out. Set up your furniture so you have a clear path. Avoid moving your furniture around. If any of your floors are uneven, fix them. If there are any pets around you, be aware of where they are. Review your medicines with your doctor. Some medicines can make you feel dizzy. This can increase your chance of falling. Ask your doctor what other things that you can do to help prevent falls. This information is not intended to replace advice given to you by your health care provider. Make sure you discuss any questions you have with your health care provider. Document Released: 04/18/2009 Document Revised: 11/28/2015 Document Reviewed: 07/27/2014 Elsevier Interactive Patient Education  2017 [employer].

## 2021-09-08 NOTE — Progress Notes (Signed)
No critical labs need to be addressed urgently. We will discuss labs in detail at upcoming office visit.   

## 2021-09-12 ENCOUNTER — Ambulatory Visit (INDEPENDENT_AMBULATORY_CARE_PROVIDER_SITE_OTHER): Payer: Medicare PPO | Admitting: Family Medicine

## 2021-09-12 ENCOUNTER — Encounter: Payer: Self-pay | Admitting: Family Medicine

## 2021-09-12 ENCOUNTER — Other Ambulatory Visit: Payer: Self-pay

## 2021-09-12 VITALS — BP 122/80 | HR 90 | Ht 64.0 in | Wt 171.2 lb

## 2021-09-12 DIAGNOSIS — Z8542 Personal history of malignant neoplasm of other parts of uterus: Secondary | ICD-10-CM | POA: Diagnosis not present

## 2021-09-12 DIAGNOSIS — Z0000 Encounter for general adult medical examination without abnormal findings: Secondary | ICD-10-CM | POA: Diagnosis not present

## 2021-09-12 DIAGNOSIS — I10 Essential (primary) hypertension: Secondary | ICD-10-CM | POA: Diagnosis not present

## 2021-09-12 DIAGNOSIS — E038 Other specified hypothyroidism: Secondary | ICD-10-CM | POA: Diagnosis not present

## 2021-09-12 DIAGNOSIS — E7800 Pure hypercholesterolemia, unspecified: Secondary | ICD-10-CM | POA: Diagnosis not present

## 2021-09-12 DIAGNOSIS — Z23 Encounter for immunization: Secondary | ICD-10-CM | POA: Diagnosis not present

## 2021-09-12 NOTE — Assessment & Plan Note (Signed)
Stable, chronic.  Continue current medication. ? ? ? amlodipine 10 daily ?

## 2021-09-12 NOTE — Patient Instructions (Addendum)
Keep working on exercise routine, weight loss, healthy eating habits. ? ?Please call the location of your choice from the menu below to schedule your Mammogram and/or Bone Density appointment.   ? ?[place]  ? ?[facility]                ?      Phone:  [phone] ?[address]                               ?[place], [place] [address]                                                             ?Services: Traditional and 3D Mammogram, Bone Density  ? ?[facility]           ?      Phone: [phone] ?[address]                                                       ?[place], [place] [address]    ?Service: Bone Density ONLY  ? *this site does NOT perform mammograms ? ?[place]                       ? Phone:  [phone] ?[address]                                  ?[place], [place] [address]                                            ?Services:  3D Mammogram and Bone Density  ? ? ?[place] ? ?[place] [facility]   ?Phone:  [phone]   ?[place], [place] [address]                                            ?Services: 3D Mammogram and Bone Density ? ?[place] [facility]  ?Phone:  [phone]   ?[address]                        ?[place], [place] [address]                                              ?  Services:  3D Mammogram and Bone Density ? ? ?

## 2021-09-12 NOTE — Assessment & Plan Note (Signed)
Chronic , stable control on  No medicaiton. ?

## 2021-09-12 NOTE — Progress Notes (Signed)
Patient ID: Betty Sanchez, female    DOB: 17-Aug-1945, 76 y.o.   MRN: [id number]  This visit was conducted in person.  BP 122/80    Pulse 90    Ht '5\' 4"'$  (1.626 m)    Wt 171 lb 3.2 oz (77.7 kg)    SpO2 97%    BMI 29.39 kg/m    CC: Chief Complaint  Patient presents with   Annual Exam    Physical , discuss labs results     Subjective:   HPI: Betty Sanchez is a 76 y.o. female presenting on 09/12/2021 for Annual Exam (Physical , discuss labs results )  The patient presents for complete physical and review of chronic health problems. He/She also has the following acute concerns today:  The patient saw a LPN or RN for medicare wellness visit.  Prevention and wellness was reviewed in detail. Note reviewed and important notes copied below.  Hx of endometrial cancer S/P TRH/SBO 01/2020. She opted against radiation followed by active surveillance by Dr. Berline Lopes at [facility]  Elevated Cholesterol: Inadequate control of cholesterol, but improving  since March 2022 with natural cholesterol medication treatment. Statin is indicated Lab Results  Component Value Date   CHOL 245 (H) 09/05/2021   HDL 68.60 09/05/2021   LDLCALC 155 (H) 09/05/2021   TRIG 107.0 09/05/2021   CHOLHDL 4 09/05/2021  The 10-year ASCVD risk score (Arnett DK, et al., 2019) is: 17.8%   Values used to calculate the score:     Age: 57 years     Sex: Female     Is Non-Hispanic African American: No     Diabetic: No     Tobacco smoker: No     Systolic Blood Pressure: 703 mmHg     Is BP treated: Yes     HDL Cholesterol: 68.6 mg/dL     Total Cholesterol: 245 mg/dL Using medications without problems: Muscle aches:  Diet compliance: She has been  eating less packaged foods. Exercise: 7000-10000 steps a day, weight training Other complaints:  Hypertension: Well-controlled on Norvasc 10 mg daily BP Readings from Last 3 Encounters:  09/12/21 122/80  07/20/21 125/87  06/06/21 (!) 153/85  Using medication without  problems or lightheadedness:  none Chest pain with exertion: none Edema:none Short of breath:none Average home BPs: 134-140/58-78, HR 58-70 Other issues:   Subclinical hypothyroidism:  She states felt  wore on levothyroxine in the past. Lab Results  Component Value Date   TSH 9.00 (H) 09/05/2021    She has been working on low carb diet  Wt Readings from Last 3 Encounters:  09/12/21 171 lb 3.2 oz (77.7 kg)  09/05/21 168 lb (76.2 kg)  06/06/21 165 lb (74.8 kg)       Relevant past medical, surgical, family and social history reviewed and updated as indicated. Interim medical history since our last visit reviewed. Allergies and medications reviewed and updated. Outpatient Medications Prior to Visit  Medication Sig Dispense Refill   amLODipine (NORVASC) 10 MG tablet TAKE 1 TABLET(10 MG) BY MOUTH DAILY 90 tablet 0   No facility-administered medications prior to visit.     Per HPI unless specifically indicated in ROS section below Review of Systems  Constitutional:  Negative for fatigue and fever.  HENT:  Negative for congestion.   Eyes:  Negative for pain.  Respiratory:  Negative for cough and shortness of breath.   Cardiovascular:  Negative for chest pain, palpitations and leg swelling.  Gastrointestinal:  Negative for abdominal  pain.  Genitourinary:  Negative for dysuria and vaginal bleeding.  Musculoskeletal:  Negative for back pain.  Neurological:  Negative for syncope, light-headedness and headaches.  Psychiatric/Behavioral:  Negative for dysphoric mood.   Objective:  BP 122/80    Pulse 90    Ht '5\' 4"'$  (1.626 m)    Wt 171 lb 3.2 oz (77.7 kg)    SpO2 97%    BMI 29.39 kg/m   Wt Readings from Last 3 Encounters:  09/12/21 171 lb 3.2 oz (77.7 kg)  09/05/21 168 lb (76.2 kg)  06/06/21 165 lb (74.8 kg)      Physical Exam Constitutional:      General: She is not in acute distress.    Appearance: Normal appearance. She is well-developed. She is not ill-appearing or  toxic-appearing.  HENT:     Head: Normocephalic.     Right Ear: Hearing, tympanic membrane, ear canal and external ear normal. Tympanic membrane is not erythematous, retracted or bulging.     Left Ear: Hearing, tympanic membrane, ear canal and external ear normal. Tympanic membrane is not erythematous, retracted or bulging.     Nose: No mucosal edema or rhinorrhea.     Right Sinus: No maxillary sinus tenderness or frontal sinus tenderness.     Left Sinus: No maxillary sinus tenderness or frontal sinus tenderness.     Mouth/Throat:     Pharynx: Uvula midline.  Eyes:     General: Lids are normal. Lids are everted, no foreign bodies appreciated.     Conjunctiva/sclera: Conjunctivae normal.     Pupils: Pupils are equal, round, and reactive to light.  Neck:     Thyroid: No thyroid mass or thyromegaly.     Vascular: No carotid bruit.     Trachea: Trachea normal.  Cardiovascular:     Rate and Rhythm: Normal rate and regular rhythm.     Pulses: Normal pulses.     Heart sounds: Normal heart sounds, S1 normal and S2 normal. No murmur heard.   No friction rub. No gallop.  Pulmonary:     Effort: Pulmonary effort is normal. No tachypnea or respiratory distress.     Breath sounds: Normal breath sounds. No decreased breath sounds, wheezing, rhonchi or rales.  Abdominal:     General: Bowel sounds are normal.     Palpations: Abdomen is soft.     Tenderness: There is no abdominal tenderness.  Musculoskeletal:     Cervical back: Normal range of motion and neck supple.  Skin:    General: Skin is warm and dry.     Findings: No rash.  Neurological:     Mental Status: She is alert.  Psychiatric:        Mood and Affect: Mood is not anxious or depressed.        Speech: Speech normal.        Behavior: Behavior normal. Behavior is cooperative.        Thought Content: Thought content normal.        Judgment: Judgment normal.      Results for orders placed or performed in visit on 09/05/21  TSH   Result Value Ref Range   TSH 9.00 (H) 0.35 - 5.50 uIU/mL  T4, free  Result Value Ref Range   Free T4 0.70 0.60 - 1.60 ng/dL  T3, free  Result Value Ref Range   T3, Free 3.4 2.3 - 4.2 pg/mL  Comprehensive metabolic panel  Result Value Ref Range   Sodium 140 135 -  145 mEq/L   Potassium 4.3 3.5 - 5.1 mEq/L   Chloride 105 96 - 112 mEq/L   CO2 25 19 - 32 mEq/L   Glucose, Bld 95 70 - 99 mg/dL   BUN 12 6 - 23 mg/dL   Creatinine, Ser 0.63 0.40 - 1.20 mg/dL   Total Bilirubin 0.6 0.2 - 1.2 mg/dL   Alkaline Phosphatase 59 39 - 117 U/L   AST 13 0 - 37 U/L   ALT 10 0 - 35 U/L   Total Protein 7.2 6.0 - 8.3 g/dL   Albumin 4.3 3.5 - 5.2 g/dL   GFR 87.53 >60.00 mL/min   Calcium 9.6 8.4 - 10.5 mg/dL  Lipid panel  Result Value Ref Range   Cholesterol 245 (H) 0 - 200 mg/dL   Triglycerides 107.0 0.0 - 149.0 mg/dL   HDL 68.60 >39.00 mg/dL   VLDL 21.4 0.0 - 40.0 mg/dL   LDL Cholesterol 155 (H) 0 - 99 mg/dL   Total CHOL/HDL Ratio 4    NonHDL 176.57     This visit occurred during the SARS-CoV-2 public health emergency.  Safety protocols were in place, including screening questions prior to the visit, additional usage of staff PPE, and extensive cleaning of exam room while observing appropriate contact time as indicated for disinfecting solutions.   COVID 19 screen:  No recent travel or known exposure to COVID19 The patient denies respiratory symptoms of COVID 19 at this time. The importance of social distancing was discussed today.   Assessment and Plan The patient's preventative maintenance and recommended screening tests for an annual wellness exam were reviewed in full today. Brought up to date unless services declined.  Counselled on the importance of diet, exercise, and its role in overall health and mortality. The patient's FH and SH was reviewed, including their home life, tobacco status, and drug and alcohol status.    Vaccines: Due for Shingrix, has had 3 COVID doses, overdue for  pneumonia series Pap/DVE: Status post total hysterectomy Mammo: Overdue,  not interested Bone Density: Overdue not interested Colon: Overdue  but she is hesitant given almost 76.. does not want to proceed. Consider cologuard. smoking Status: None ETOH/ drug use: None/none  Hep C: Done    Problem List Items Addressed This Visit     Essential hypertension    Stable, chronic.  Continue current medication.    amlodipine 10 daily      History of endometrial cancer    ONC Dr. Berline Lopes  Active surveillance.      Hypercholesteremia     Improved control of LDL but not at goal.  She continues to not be interested in statin treatment.  Will continue to work on healthy lifestyle changes      Subclinical hypothyroidism    Chronic , stable control on  No medicaiton.      Other Visit Diagnoses     Routine general medical examination at a health care facility    -  Primary       Eliezer Lofts, MD

## 2021-09-12 NOTE — Assessment & Plan Note (Signed)
Improved control of LDL but not at goal. ? She continues to not be interested in statin treatment. ? Will continue to work on healthy lifestyle changes ?

## 2021-09-12 NOTE — Addendum Note (Signed)
Addended by: Elta Guadeloupe on: 09/12/2021 03:11 PM ? ? Modules accepted: Orders ? ?

## 2021-09-12 NOTE — Assessment & Plan Note (Signed)
ONC Dr. Berline Lopes ? Active surveillance. ?

## 2021-09-13 ENCOUNTER — Other Ambulatory Visit: Payer: Self-pay | Admitting: Family Medicine

## 2021-09-13 DIAGNOSIS — I10 Essential (primary) hypertension: Secondary | ICD-10-CM

## 2021-10-01 ENCOUNTER — Other Ambulatory Visit: Payer: Medicare PPO

## 2021-11-03 DIAGNOSIS — Z961 Presence of intraocular lens: Secondary | ICD-10-CM | POA: Diagnosis not present

## 2021-12-10 ENCOUNTER — Encounter: Payer: Self-pay | Admitting: Gynecologic Oncology

## 2021-12-15 ENCOUNTER — Inpatient Hospital Stay: Payer: Medicare PPO | Attending: Gynecologic Oncology | Admitting: Gynecologic Oncology

## 2021-12-15 ENCOUNTER — Other Ambulatory Visit: Payer: Self-pay

## 2021-12-15 ENCOUNTER — Encounter: Payer: Self-pay | Admitting: Gynecologic Oncology

## 2021-12-15 VITALS — BP 148/87 | HR 114 | Temp 97.7°F | Resp 18 | Ht 64.0 in | Wt 172.4 lb

## 2021-12-15 DIAGNOSIS — N898 Other specified noninflammatory disorders of vagina: Secondary | ICD-10-CM | POA: Diagnosis not present

## 2021-12-15 DIAGNOSIS — Z90710 Acquired absence of both cervix and uterus: Secondary | ICD-10-CM | POA: Diagnosis not present

## 2021-12-15 DIAGNOSIS — Z8542 Personal history of malignant neoplasm of other parts of uterus: Secondary | ICD-10-CM | POA: Insufficient documentation

## 2021-12-15 DIAGNOSIS — Z90722 Acquired absence of ovaries, bilateral: Secondary | ICD-10-CM | POA: Diagnosis not present

## 2021-12-15 DIAGNOSIS — N761 Subacute and chronic vaginitis: Secondary | ICD-10-CM | POA: Diagnosis not present

## 2021-12-15 NOTE — Patient Instructions (Addendum)
It was good to see you today.    I will call you when I get your biopsy results back, hopefully by the middle of this week.  Please call sometime in the late fall to schedule a visit with me in December.  My schedule is not out past September at this time.  As always, if you develop any new and concerning symptoms for recurrence before your next visit with me, please call to see me sooner.

## 2021-12-15 NOTE — Progress Notes (Signed)
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 ENDOMETRIUM, BIOPSY:  - Scant benign endocervical glandular type epithelium present in the  background of a majority of blood.  - No distinct endometrial tissue present for evaluation.    12/18/2019 Surgery   D&C: A. ENDOMETRIUM, CURETTAGE:  -  Benign squamous and endocervical glandular epithelium with  microglandular adenosis  -  Benign lower uterine segment  -  No malignancy identified  -  See comment    01/10/2020 Surgery   Hysteroscopic endometrial mass resection: A. ENDOMETRIUM, RESECTION:  - Complex atypical hyperplasia, at least partially involving an  endometrial polyp.   01/24/2020 Surgery   TRH/BSO, SLN injection, bilateral pelvic LND  On EUA, small mobile uterus. On itnra-abdominal entry, normal upper abdominal survey, stomach, small and large bowel, and omentum. Normal appearing bilateral adnexa. Uterus 8cm. Hemosiderin staining along left pelvis c/w history of endometriosis. Mapping to channels that met on the right external iliac artery - no clearly lymphatic tissue. Mapping along the broad ligament on the left without obvious SLN.  No intra-abdominal or pelvic evidence of disease.    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            Tumor invades more than one half of the myometrium.            Leiomyoma.            See oncology table.       Serosa: Unremarkable. No malignancy.  - Cervix: Benign squamous and endocervical mucosa. No dysplasia or  malignancy.  -  Bilateral ovaries: Benign serous cystadenoma. No malignancy.  - Bilateral fallopian tubes: Adhesions. No malignancy.   B. SOFT TISSUE, RIGHT EXTERNAL ILIAC, BIOPSY:  - Benign soft tissue.  - No lymphoid tissue identified.   C. LYMPH NODES, RIGHT PELVIC, REGIONAL SECTION:  - Six of six lymph nodes negative for malignancy (0/6).   D. LYMPH NODES, LEFT PELVIC, REGIONAL SECTION:  - Nine of nine lymph nodes negative for malignancy (0/9).    ONCOLOGY TABLE:   UTERUS, CARCINOMA OR CARCINOSARCOMA   Procedure: Total hysterectomy and bilateral salpingo-oophorectomy with  lymph node resections  Histologic type: Endometrioid adenocarcinoma  Histologic Grade: FIGO grade I  Myometrial invasion:       Depth of invasion: 8 mm       Myometrial thickness: 12 mm  Uterine Serosa Involvement: Not identified  Cervical stromal involvement: Not identified  Extent of involvement of other organs: Not identified  Lymphovascular invasion: Not identified  Regional Lymph Nodes:       Examined:       0 Sentinel                               15 non-sentinel                               15 total        Lymph nodes with metastasis:   0        Isolated tumor cells (<0.2 mm): 0        Micrometastasis:  (>0.2 mm and < 2.0 mm): 0        Macrometastasis: (>2.0 mm): 0        Extracapsular extension: Not applicable  Representative Tumor Block: A5  MMR / MSI testing: Will be ordered    01/24/2020 Pathologic Stage   Stage IB, grade 1 No LVSI, negative LNDs   01/24/2020 Cancer Staging   Staging form: Corpus Uteri - Carcinoma and Carcinosarcoma, AJCC 8th Edition - Clinical stage from 01/24/2020: FIGO Stage IB (cT1b, cN0, cM0) - Signed by Tucker, Katherine R, MD on 01/31/2020     Interval History: Patient reports overall doing very well.  She denies any vaginal bleeding, discharge, or pelvic pain.  She reports regular bowel and bladder function.  She is looking forward to heading to the beach later this week.   Continues to work frequently in her garden.  Past Medical/Surgical History: Past Medical History:  Diagnosis Date   Abnormal uterine bleeding (AUB)    Anxiety    very anxious in a medical setting   Cataract 2019   bilateral eyes; corrected with surgery   Fall 12/2015   multiple falls in week    Hypertension    Hypothyroidism     Past Surgical History:  Procedure Laterality Date   APPENDECTOMY  age 7   CATARACT EXTRACTION W/ INTRAOCULAR LENS  IMPLANT, BILATERAL  2019   DILATATION & CURETTAGE/HYSTEROSCOPY WITH MYOSURE N/A 01/10/2020   Procedure: MYOSURE WITH ENDOMETRIAL SAMPLING;  Surgeon: Tucker, Katherine R, MD;  Location: [facility];  Service: Gynecology;  Laterality: N/A;   HYSTEROSCOPY WITH D & C N/A 12/18/2019   Procedure: DILATATION AND CURETTAGE /HYSTEROSCOPY;  Surgeon: Arnold, James G, MD;  Location: [facility];  Service: Gynecology;  Laterality: N/A;   ROBOTIC ASSISTED TOTAL HYSTERECTOMY WITH BILATERAL SALPINGO OOPHERECTOMY N/A 01/24/2020   Procedure: XI ROBOTIC ASSISTED TOTAL HYSTERECTOMY WITH BILATERAL SALPINGO OOPHORECTOMY;  Surgeon: Tucker, Katherine R, MD;  Location: [facility];  Service: Gynecology;  Laterality: N/A;   SENTINEL NODE BIOPSY N/A 01/24/2020   Procedure: SENTINEL NODE INJECTION BIOPSY, BILATERAL PELVIC LYMPH NODE DISSECTION;  Surgeon: Tucker, Katherine R, MD;  Location:  SURGERY CENTER;  Service: Gynecology;  Laterality: N/A;   TONSILLECTOMY  age 5    Family History  Problem Relation Age of Onset   Breast cancer Neg Hx    Ovarian cancer Neg Hx    Endometrial cancer Neg Hx    Colon cancer Neg Hx     Social History   Socioeconomic History   Marital status: Single    Spouse name: Not on file   Number of children: 0   Years of education: 17   Highest education level: Not on file  Occupational History   Not on file  Tobacco Use   Smoking status: Never   Smokeless tobacco: Never  Vaping Use    Vaping Use: Never used  Substance and Sexual Activity   Alcohol use: Never    Alcohol/week: 1.0 standard drink of alcohol    Types: 1 Glasses of wine per week   Drug use: Never   Sexual activity: Not Currently  Other Topics Concern   Not on file  Social History Narrative   Not on file   Social Determinants of Health   Financial Resource Strain: Low Risk  (09/05/2021)   Overall Financial   Resource Strain (CARDIA)    Difficulty of Paying Living Expenses: Not hard at all  Food Insecurity: No Food Insecurity (09/05/2021)   Hunger Vital Sign    Worried About Running Out of Food in the Last Year: Never true    Ran Out of Food in the Last Year: Never true  Transportation Needs: No Transportation Needs (09/05/2021)   PRAPARE - Hydrologist (Medical): No    Lack of Transportation (Non-Medical): No  Physical Activity: Sufficiently Active (09/05/2021)   Exercise Vital Sign    Days of Exercise per Week: 7 days    Minutes of Exercise per Session: 50 min  Stress: No Stress Concern Present (09/05/2021)   [place]    Feeling of Stress : Only a little  Social Connections: Moderately Integrated (09/05/2021)   Social Connection and Isolation Panel [NHANES]    Frequency of Communication with Friends and Family: More than three times a week    Frequency of Social Gatherings with Friends and Family: More than three times a week    Attends Religious Services: More than 4 times per year    Active Member of [employer] or Organizations: Yes    Attends Music therapist: More than 4 times per year    Marital Status: Never married    Current Medications:  Current Outpatient Medications:    amLODipine (NORVASC) 10 MG tablet, TAKE 1 TABLET(10 MG) BY MOUTH DAILY, Disp: 90 tablet, Rfl: 3  Review of Systems: Denies appetite changes, fevers, chills, fatigue, unexplained weight changes. Denies hearing loss,  neck lumps or masses, mouth sores, ringing in ears or voice changes. Denies cough or wheezing.  Denies shortness of breath. Denies chest pain or palpitations. Denies leg swelling. Denies abdominal distention, pain, blood in stools, constipation, diarrhea, nausea, vomiting, or early satiety. Denies pain with intercourse, dysuria, frequency, hematuria or incontinence. Denies hot flashes, pelvic pain, vaginal bleeding or vaginal discharge.   Denies joint pain, back pain or muscle pain/cramps. Denies itching, rash, or wounds. Denies dizziness, headaches, numbness or seizures. Denies swollen lymph nodes or glands, denies easy bruising or bleeding. Denies anxiety, depression, confusion, or decreased concentration.  Physical Exam: BP (!) 148/87   Pulse (!) 114   Temp 97.7 F (36.5 C) (Tympanic)   Resp 18   Ht 5' 4" (1.626 m)   Wt 172 lb 6.4 oz (78.2 kg)   SpO2 100%   BMI 29.59 kg/m  General: Alert, oriented, no acute distress. HEENT: Normocephalic, atraumatic, sclera anicteric. Chest: Clear to auscultation bilaterally.  No wheezes or rhonchi. Cardiovascular: Regular rate and rhythm, no murmurs. Abdomen: soft, nontender.  Normoactive bowel sounds.  No masses or hepatosplenomegaly appreciated.  Well-healed incisions. Extremities: Grossly normal range of motion.  Warm, well perfused.  No edema bilaterally. Skin: No rashes or lesions noted. Lymphatics: No cervical, supraclavicular, or inguinal adenopathy. GU: Normal appearing external genitalia without erythema, excoriation, or lesions. Speculum exam reveals moderately atrophic vaginal mucosa, no bleeding, discharge, or masses noted.  Along the cuff and left sidewall, there is some more prominent almost glandular appearing tissue, new since her last visit.  Cuff intact.  Bimanual exam reveals cuff intact, no masses or nodularity.  Rectovaginal exam confirms findings.  Vaginal cuff biopsy Preoperative diagnosis: History of endometrial cancer,  visual changes of the vaginal mucosa sent Postoperative diagnosis: Same as above Specimen: Vaginal biopsy, left sidewall/posterior cuff Estimated blood loss: Less than 5 cc Procedure: After the  procedure was discussed with the patient, she gave verbal consent.  She was already in dorsolithotomy position with a speculum in the vagina.  The area to be biopsied was cleansed with Betadine x3.  Tischler forceps were then used to take a biopsy of the vaginal wall.  This was placed in formalin.  Silver nitrate was used to achieve hemostasis.  All instruments were removed from the vagina.  Overall she tolerated the procedure well.  Laboratory & Radiologic Studies: None new  Assessment & Plan: Betty Sanchez is a 75 y.o. 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 S/p definitive surgery 01/2020. MMR intact, MS stable.   Betty Sanchez continues to do very well.  There are some mild changes to the vaginal mucosa at the left apex and sidewall.  While new from her last visit, my suspicion is that this does not represent cancer recurrence.  Biopsy performed today to assure no recurrent disease.  I will call her with these results.   NCCN surveillance guidelines recommend visits every 6 months for 5 years.  As long as biopsy reveals no recurrent cancer, we will continue with visits every 6 months.  Patient is aware of signs and symptoms to call if she develops between visits.  22 minutes of total time was spent for this patient encounter, including preparation, face-to-face counseling with the patient and coordination of care, and documentation of the encounter.  Katherine Tucker, MD  Division of Gynecologic Oncology  Department of Obstetrics and Gynecology  [facility]   

## 2021-12-17 LAB — SURGICAL PATHOLOGY

## 2022-04-01 ENCOUNTER — Encounter: Payer: Self-pay | Admitting: Family Medicine

## 2022-08-05 ENCOUNTER — Encounter: Payer: Self-pay | Admitting: Gynecologic Oncology

## 2022-08-06 NOTE — Progress Notes (Signed)
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 ENDOMETRIUM, BIOPSY:  - Scant benign endocervical glandular type epithelium present in the  background of a majority of blood.  - No distinct endometrial tissue present for evaluation.    12/18/2019 Surgery   D&C: A. ENDOMETRIUM, CURETTAGE:  -  Benign squamous and endocervical glandular epithelium with  microglandular adenosis  -  Benign lower uterine segment  -  No malignancy identified  -  See comment    01/10/2020 Surgery   Hysteroscopic endometrial mass resection: A. ENDOMETRIUM, RESECTION:  - Complex atypical hyperplasia, at least partially involving an  endometrial polyp.   01/24/2020 Surgery   TRH/BSO, SLN injection, bilateral pelvic LND  On EUA, small mobile uterus. On itnra-abdominal entry, normal upper abdominal survey, stomach, small and large bowel, and omentum. Normal appearing bilateral adnexa. Uterus 8cm. Hemosiderin staining along left pelvis c/w history of endometriosis. Mapping to channels that met on the right external iliac artery - no clearly lymphatic tissue. Mapping along the broad ligament on the left without obvious SLN.  No intra-abdominal or pelvic evidence of disease.    01/24/2020 Initial Diagnosis   Endometrial cancer ([place])   01/24/2020 Pathology Results   UTERUS, CERVIX, BILATERAL FALLOPIAN TUBES AND OVARIES:  - Uterus:       Endomyometrium: Endometrioid adenocarcinoma, FIGO grade 1, spanning  2.7 cm.            Tumor invades more than one half of the myometrium.            Leiomyoma.            See oncology table.       Serosa: Unremarkable. No malignancy.  - Cervix: Benign squamous and endocervical mucosa. No dysplasia or  malignancy.  -  Bilateral ovaries: Benign serous cystadenoma. No malignancy.  - Bilateral fallopian tubes: Adhesions. No malignancy.   B. SOFT TISSUE, RIGHT EXTERNAL ILIAC, BIOPSY:  - Benign soft tissue.  - No lymphoid tissue identified.   C. LYMPH NODES, RIGHT PELVIC, REGIONAL SECTION:  - Six of six lymph nodes negative for malignancy (0/6).   D. LYMPH NODES, LEFT PELVIC, REGIONAL SECTION:  - Nine of nine lymph nodes negative for malignancy (0/9).    ONCOLOGY TABLE:   UTERUS, CARCINOMA OR CARCINOSARCOMA   Procedure: Total hysterectomy and bilateral salpingo-oophorectomy with  lymph node resections  Histologic type: Endometrioid adenocarcinoma  Histologic Grade: FIGO grade I  Myometrial invasion:       Depth of invasion: 8 mm       Myometrial thickness: 12 mm  Uterine Serosa Involvement: Not identified  Cervical stromal involvement: Not identified  Extent of involvement of other organs: Not identified  Lymphovascular invasion: Not identified  Regional Lymph Nodes:       Examined:       0 Sentinel                               15 non-sentinel                               15 total        Lymph nodes with  metastasis: 0        Isolated tumor cells (<0.2 mm): 0        Micrometastasis:  (>0.2 mm and < 2.0 mm): 0        Macrometastasis: (>2.0 mm): 0        Extracapsular extension: Not applicable  Representative Tumor Block: A5  MMR / MSI testing: Will be ordered    01/24/2020 Pathologic Stage   Stage IB, grade 1 No LVSI, negative LNDs   01/24/2020 Cancer Staging   Staging form: Corpus Uteri - Carcinoma and Carcinosarcoma, AJCC 8th Edition - Clinical stage from 01/24/2020: FIGO Stage IB (cT1b, cN0, cM0) - Signed by Lafonda Mosses, MD on 01/31/2020     Interval History: Doing well.  Denies any vaginal bleeding or discharge.  Denies any pelvic or abdominal pain.  Reports baseline bowel bladder function.  Past Medical/Surgical History: Past Medical History:  Diagnosis Date   Abnormal  uterine bleeding (AUB)    Anxiety    very anxious in a medical setting   Cataract 2019   bilateral eyes; corrected with surgery   Fall 12/2015   multiple falls in week    Hypertension    Hypothyroidism     Past Surgical History:  Procedure Laterality Date   APPENDECTOMY  age 67   CATARACT EXTRACTION W/ [place], BILATERAL  2019   DILATATION & CURETTAGE/HYSTEROSCOPY WITH MYOSURE N/A 01/10/2020   Procedure: [place];  Surgeon: Lafonda Mosses, MD;  Location: [facility];  Service: Gynecology;  Laterality: N/A;   HYSTEROSCOPY WITH D & C N/A 12/18/2019   Procedure: DILATATION AND CURETTAGE /HYSTEROSCOPY;  Surgeon: Woodroe Mode, MD;  Location: [place];  Service: Gynecology;  Laterality: N/A;   ROBOTIC ASSISTED TOTAL HYSTERECTOMY WITH BILATERAL SALPINGO OOPHERECTOMY N/A 01/24/2020   Procedure: XI ROBOTIC ASSISTED TOTAL HYSTERECTOMY WITH BILATERAL SALPINGO OOPHORECTOMY;  Surgeon: Lafonda Mosses, MD;  Location: [facility];  Service: Gynecology;  Laterality: N/A;   SENTINEL NODE BIOPSY N/A 01/24/2020   Procedure: SENTINEL NODE INJECTION BIOPSY, BILATERAL PELVIC LYMPH NODE DISSECTION;  Surgeon: Lafonda Mosses, MD;  Location: [place];  Service: Gynecology;  Laterality: N/A;   TONSILLECTOMY  age 52    Family History  Problem Relation Age of Onset   Breast cancer Neg Hx    Ovarian cancer Neg Hx    Endometrial cancer Neg Hx    Colon cancer Neg Hx     Social History   Socioeconomic History   Marital status: Single    Spouse name: Not on file   Number of children: 0   Years of education: 17   Highest education level: Not on file  Occupational History   Not on file  Tobacco Use   Smoking status: Never   Smokeless tobacco: Never  Vaping Use   Vaping Use: Never used  Substance and Sexual Activity   Alcohol use: Never    Alcohol/week: 1.0 standard drink of alcohol     Types: 1 Glasses of wine per week   Drug use: Never   Sexual activity: Not Currently  Other Topics Concern   Not on file  Social History Narrative   Not on file   Social Determinants of Health   Financial Resource Strain: Low Risk  (09/05/2021)   Overall Financial Resource Strain (CARDIA)    Difficulty of Paying Living Expenses: Not hard at all  Food Insecurity: No Food Insecurity (09/05/2021)  Hunger Vital Sign    Worried About Running Out of Food in the Last Year: Never true    Ran Out of Food in the Last Year: Never true  Transportation Needs: No Transportation Needs (09/05/2021)   PRAPARE - Hydrologist (Medical): No    Lack of Transportation (Non-Medical): No  Physical Activity: Sufficiently Active (09/05/2021)   Exercise Vital Sign    Days of Exercise per Week: 7 days    Minutes of Exercise per Session: 50 min  Stress: No Stress Concern Present (09/05/2021)   [place]    Feeling of Stress : Only a little  Social Connections: Moderately Integrated (09/05/2021)   Social Connection and Isolation Panel [NHANES]    Frequency of Communication with Friends and Family: More than three times a week    Frequency of Social Gatherings with Friends and Family: More than three times a week    Attends Religious Services: More than 4 times per year    Active Member of [employer] or Organizations: Yes    Attends Music therapist: More than 4 times per year    Marital Status: Never married    Current Medications:  Current Outpatient Medications:    amLODipine (NORVASC) 10 MG tablet, TAKE 1 TABLET(10 MG) BY MOUTH DAILY, Disp: 90 tablet, Rfl: 3  Review of Systems: Denies appetite changes, fevers, chills, fatigue, unexplained weight changes. Denies hearing loss, neck lumps or masses, mouth sores, ringing in ears or voice changes. Denies cough or wheezing.  Denies shortness of breath. Denies  chest pain or palpitations. Denies leg swelling. Denies abdominal distention, pain, blood in stools, constipation, diarrhea, nausea, vomiting, or early satiety. Denies pain with intercourse, dysuria, frequency, hematuria or incontinence. Denies hot flashes, pelvic pain, vaginal bleeding or vaginal discharge.   Denies joint pain, back pain or muscle pain/cramps. Denies itching, rash, or wounds. Denies dizziness, headaches, numbness or seizures. Denies swollen lymph nodes or glands, denies easy bruising or bleeding. Denies anxiety, depression, confusion, or decreased concentration.  Physical Exam: BP (!) 152/84   Pulse (!) 120   Temp 97.8 F (36.6 C)   Resp 18   Wt 175 lb 4.8 oz (79.5 kg)   SpO2 100%   BMI 30.09 kg/m  General: Alert, oriented, no acute distress. HEENT: Normocephalic, atraumatic, sclera anicteric. Chest: Clear to auscultation bilaterally.  No wheezes or rhonchi. Cardiovascular: Tachycardic, regular rhythm, no murmurs. Abdomen: soft, nontender.  Normoactive bowel sounds.  No masses or hepatosplenomegaly appreciated.  Well-healed incisions. Extremities: Grossly normal range of motion.  Warm, well perfused.  No edema bilaterally. Skin: No rashes or lesions noted. Lymphatics: No cervical, supraclavicular, or inguinal adenopathy. GU: Normal appearing external genitalia without erythema, excoriation, or lesions. Speculum exam reveals moderately atrophic vaginal mucosa, no bleeding, discharge, or masses noted.  Along the cuff and left sidewall, there is some more prominent almost glandular appearing tissue, stable from last exam (previously biopsied and negative for cancer).  Cuff intact.  Bimanual exam reveals cuff intact, no masses or nodularity.  Rectovaginal exam confirms findings.  Laboratory & Radiologic Studies: 12/2021: left vaginal sidewall biopsy - squamous mucosa with chronic inflammation and atrophic changes, no dysplasia or malignancy  Assessment & Plan: Betty Sanchez is a 77 y.o. 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 S/p definitive surgery 01/2020. MMR intact, MS stable.   Betty Sanchez continues to do very well. She is NED  on exam today.   NCCN surveillance guidelines recommend visits every 6 months for 5 years. Patient is aware of signs and symptoms to call if she develops between visits.  20 minutes of total time was spent for this patient encounter, including preparation, face-to-face counseling with the patient and coordination of care, and documentation of the encounter.  Jeral Pinch, MD  Division of Gynecologic Oncology  Department of Obstetrics and Gynecology  [facility]

## 2022-08-07 ENCOUNTER — Encounter: Payer: Self-pay | Admitting: Gynecologic Oncology

## 2022-08-07 ENCOUNTER — Inpatient Hospital Stay: Payer: Medicare PPO | Attending: Gynecologic Oncology | Admitting: Gynecologic Oncology

## 2022-08-07 VITALS — BP 152/84 | HR 120 | Temp 97.8°F | Resp 18 | Wt 175.3 lb

## 2022-08-07 DIAGNOSIS — Z90710 Acquired absence of both cervix and uterus: Secondary | ICD-10-CM | POA: Diagnosis not present

## 2022-08-07 DIAGNOSIS — Z1289 Encounter for screening for malignant neoplasm of other sites: Secondary | ICD-10-CM

## 2022-08-07 DIAGNOSIS — C541 Malignant neoplasm of endometrium: Secondary | ICD-10-CM

## 2022-08-07 DIAGNOSIS — Z90722 Acquired absence of ovaries, bilateral: Secondary | ICD-10-CM | POA: Insufficient documentation

## 2022-08-07 DIAGNOSIS — Z8542 Personal history of malignant neoplasm of other parts of uterus: Secondary | ICD-10-CM

## 2022-08-07 NOTE — Patient Instructions (Signed)
It was good to see you today.  I do not see or feel any evidence of cancer recurrence on your exam.  I will see you for follow-up in 6 months.  My clinic schedule is not out past the end of June.  Please call back sometime in May or June to get a visit scheduled to see me in August.  As always, if you develop any new and concerning symptoms before your next visit, please call to see me sooner.

## 2022-09-08 ENCOUNTER — Other Ambulatory Visit: Payer: Self-pay | Admitting: Family Medicine

## 2022-09-08 DIAGNOSIS — I10 Essential (primary) hypertension: Secondary | ICD-10-CM

## 2022-09-15 ENCOUNTER — Telehealth: Payer: Self-pay | Admitting: *Deleted

## 2022-09-15 DIAGNOSIS — E038 Other specified hypothyroidism: Secondary | ICD-10-CM

## 2022-09-15 DIAGNOSIS — E7800 Pure hypercholesterolemia, unspecified: Secondary | ICD-10-CM

## 2022-09-15 DIAGNOSIS — I10 Essential (primary) hypertension: Secondary | ICD-10-CM

## 2022-09-15 NOTE — Telephone Encounter (Signed)
-----   Message from Ellamae Sia sent at 09/15/2022 11:04 AM EDT ----- Regarding: Lab orders for Friday,3.29.24 Patient is scheduled for CPX labs, please order future labs, Thanks , Karna Christmas

## 2022-09-28 ENCOUNTER — Ambulatory Visit (INDEPENDENT_AMBULATORY_CARE_PROVIDER_SITE_OTHER): Payer: Medicare PPO

## 2022-09-28 VITALS — Ht 65.0 in | Wt 174.0 lb

## 2022-09-28 DIAGNOSIS — Z0000 Encounter for general adult medical examination without abnormal findings: Secondary | ICD-10-CM | POA: Diagnosis not present

## 2022-09-28 NOTE — Progress Notes (Signed)
I connected with  Derian Hollar on 09/28/22 by a audio enabled telemedicine application and verified that I am speaking with the correct person using two identifiers.  Patient Location: Home  Provider Location: Office/Clinic  I discussed the limitations of evaluation and management by telemedicine. The patient expressed understanding and agreed to proceed.  Subjective:   Zanariah Dieckman is a 77 y.o. female who presents for Medicare Annual (Subsequent) preventive examination.  Review of Systems      Cardiac Risk Factors include: advanced age (>8men, >18 women);hypertension     Objective:    Today's Vitals   09/28/22 1429  Weight: 174 lb (78.9 kg)  Height: 5\' 5"  (1.651 m)   Body mass index is 28.96 kg/m.     09/28/2022    2:36 PM 12/10/2021    9:59 AM 09/05/2021   11:59 AM 06/05/2021    1:23 PM 05/21/2020   12:28 PM 01/24/2020   11:41 AM 01/10/2020    7:15 AM  Advanced Directives  Does Patient Have a Medical Advance Directive? Yes Yes Yes Yes Yes Yes Yes  Type of Paramedic of [place];Living will  [place];Living will [place];Living will [place];Living will [place];Living will [place];Living will  Does patient want to make changes to medical advance directive?  No - Patient declined Yes (MAU/Ambulatory/Procedural Areas - Information given) No - Patient declined     Copy of [place] in Chart? No - copy requested     No - copy requested Yes - validated most recent copy scanned in chart (See row information)    Current Medications (verified) Outpatient Encounter Medications as of 09/28/2022  Medication Sig   amLODipine (NORVASC) 10 MG tablet TAKE 1 TABLET(10 MG) BY MOUTH DAILY   No facility-administered encounter medications on file as of 09/28/2022.    Allergies (verified) Patient has no known allergies.   History: Past Medical  History:  Diagnosis Date   Abnormal uterine bleeding (AUB)    Anxiety    very anxious in a medical setting   Cataract 2019   bilateral eyes; corrected with surgery   Fall 12/2015   multiple falls in week    Hypertension    Hypothyroidism    Past Surgical History:  Procedure Laterality Date   APPENDECTOMY  age 22   CATARACT EXTRACTION W/ [place], BILATERAL  2019   DILATATION & CURETTAGE/HYSTEROSCOPY WITH MYOSURE N/A 01/10/2020   Procedure: [place];  Surgeon: Lafonda Mosses, MD;  Location: [facility];  Service: Gynecology;  Laterality: N/A;   HYSTEROSCOPY WITH D & C N/A 12/18/2019   Procedure: DILATATION AND CURETTAGE /HYSTEROSCOPY;  Surgeon: Woodroe Mode, MD;  Location: [place];  Service: Gynecology;  Laterality: N/A;   ROBOTIC ASSISTED TOTAL HYSTERECTOMY WITH BILATERAL SALPINGO OOPHERECTOMY N/A 01/24/2020   Procedure: XI ROBOTIC ASSISTED TOTAL HYSTERECTOMY WITH BILATERAL SALPINGO OOPHORECTOMY;  Surgeon: Lafonda Mosses, MD;  Location: [facility];  Service: Gynecology;  Laterality: N/A;   SENTINEL NODE BIOPSY N/A 01/24/2020   Procedure: SENTINEL NODE INJECTION BIOPSY, BILATERAL PELVIC LYMPH NODE DISSECTION;  Surgeon: Lafonda Mosses, MD;  Location: [place];  Service: Gynecology;  Laterality: N/A;   TONSILLECTOMY  age 39   Family History  Problem Relation Age of Onset   Breast cancer Neg Hx    Ovarian cancer Neg Hx    Endometrial  cancer Neg Hx    Colon cancer Neg Hx    Social History   Socioeconomic History   Marital status: Single    Spouse name: Not on file   Number of children: 0   Years of education: 17   Highest education level: Not on file  Occupational History   Not on file  Tobacco Use   Smoking status: Never   Smokeless tobacco: Never  Vaping Use   Vaping Use: Never used  Substance and Sexual Activity   Alcohol use: Never    Alcohol/week:  1.0 standard drink of alcohol    Types: 1 Glasses of wine per week   Drug use: Never   Sexual activity: Not Currently  Other Topics Concern   Not on file  Social History Narrative   Not on file   Social Determinants of Health   Financial Resource Strain: Low Risk  (09/28/2022)   Overall Financial Resource Strain (CARDIA)    Difficulty of Paying Living Expenses: Not hard at all  Food Insecurity: No Food Insecurity (09/28/2022)   Hunger Vital Sign    Worried About Running Out of Food in the Last Year: Never true    [place] in the Last Year: Never true  Transportation Needs: No Transportation Needs (09/28/2022)   PRAPARE - Hydrologist (Medical): No    Lack of Transportation (Non-Medical): No  Physical Activity: Sufficiently Active (09/28/2022)   Exercise Vital Sign    Days of Exercise per Week: 5 days    Minutes of Exercise per Session: 60 min  Stress: No Stress Concern Present (09/28/2022)   [place]    Feeling of Stress : Not at all  Social Connections: Moderately Integrated (09/28/2022)   Social Connection and Isolation Panel [NHANES]    Frequency of Communication with Friends and Family: More than three times a week    Frequency of Social Gatherings with Friends and Family: More than three times a week    Attends Religious Services: More than 4 times per year    Active Member of [employer] or Organizations: Yes    Attends Music therapist: More than 4 times per year    Marital Status: Never married    Tobacco Counseling Counseling given: Not Answered   Clinical Intake:  Pre-visit preparation completed: Yes  Pain : No/denies pain     Nutritional Risks: None Diabetes: No  How often do you need to have someone help you when you read instructions, pamphlets, or other written materials from your doctor or pharmacy?: 1 - Never  Diabetic? no  Interpreter  Needed?: No  Information entered by :: C.Sergio Hobart LPN   Activities of Daily Living    09/28/2022    2:38 PM  In your present state of health, do you have any difficulty performing the following activities:  Hearing? 0  Vision? 0  Difficulty concentrating or making decisions? 0  Walking or climbing stairs? 0  Dressing or bathing? 0  Doing errands, shopping? 0  Preparing Food and eating ? N  Using the Toilet? N  In the past six months, have you accidently leaked urine? N  Do you have problems with loss of bowel control? N  Managing your Medications? N  Managing your Finances? N  Housekeeping or managing your Housekeeping? N    Patient Care Team: Jinny Sanders, MD as PCP - General (Family Medicine) Lafonda Mosses, MD as  Consulting Physician (Gynecologic Oncology) Dorothyann Gibbs, NP as Nurse Practitioner (Gynecologic Oncology)  Indicate any recent Medical Services you may have received from other than [facility] providers in the past year (date may be approximate).     Assessment:   This is a routine wellness examination for Shundra.  Hearing/Vision screen Hearing Screening - Comments:: No aids Vision Screening - Comments:: Readers - Dr.Bell  Dietary issues and exercise activities discussed: Current Exercise Habits: Structured exercise class;Home exercise routine, Type of exercise: strength training/weights;walking, Time (Minutes): 60, Frequency (Times/Week): 5, Weekly Exercise (Minutes/Week): 300, Intensity: Mild, Exercise limited by: None identified   Goals Addressed             This Visit's Progress    Patient Stated       Lose 5-10 lbs, get 8000-10000 steps a day.       Depression Screen    09/28/2022    2:35 PM 09/12/2021    1:58 PM 09/12/2021    1:57 PM 09/05/2021   12:01 PM 11/22/2019   10:22 AM 10/26/2019   11:36 AM 09/12/2019    9:50 AM  PHQ 2/9 Scores  PHQ - 2 Score 0 0 0 0 0 0 0  PHQ- 9 Score  0   0 0 0    Fall Risk    09/28/2022    2:38 PM  09/12/2021    1:57 PM 09/05/2021   12:00 PM 09/12/2019    9:48 AM 09/02/2018    4:12 PM  Fall Risk   Falls in the past year? 0 0 0 0 0  Number falls in past yr: 0 0 0 0   Injury with Fall? 0 0 0 0   Risk for fall due to : No Fall Risks  No Fall Risks Medication side effect   Follow up Falls prevention discussed;Falls evaluation completed Falls evaluation completed Falls prevention discussed Falls evaluation completed;Falls prevention discussed     FALL RISK PREVENTION PERTAINING TO THE HOME:  Any stairs in or around the home? Yes  If so, are there any without handrails? No  Home free of loose throw rugs in walkways, pet beds, electrical cords, etc? Yes  Adequate lighting in your home to reduce risk of falls? Yes   ASSISTIVE DEVICES UTILIZED TO PREVENT FALLS:  Life alert? No  Use of a cane, walker or w/c? No  Grab bars in the bathroom? Yes  Shower chair or bench in shower? Yes  Elevated toilet seat or a handicapped toilet? Yes    Cognitive Function:    09/12/2019    9:52 AM 09/02/2018    4:12 PM  MMSE - Mini Mental State Exam  Orientation to time 5 5  Orientation to Place 5 5  Registration 3 3  Attention/ Calculation 5 0  Recall 3 3  Language- name 2 objects  0  Language- repeat 1 1  Language- follow 3 step command  3  Language- read & follow direction  0  Write a sentence  0  Copy design  0  Total score  20        09/28/2022    2:39 PM  6CIT Screen  What Year? 0 points  What month? 0 points  What time? 0 points  Count back from 20 0 points  Months in reverse 0 points  Repeat phrase 0 points  Total Score 0 points    Immunizations Immunization History  Administered Date(s) Administered   PFIZER(Purple Top)SARS-COV-2 Vaccination 08/11/2019, 09/05/2019, 03/31/2020  PNEUMOCOCCAL CONJUGATE-20 09/12/2021   Zoster Recombinat (Shingrix) 11/27/2021, 03/10/2022    TDAP status: Due, Education has been provided regarding the importance of this vaccine. Advised may  receive this vaccine at local pharmacy or Health Dept. Aware to provide a copy of the vaccination record if obtained from local pharmacy or Health Dept. Verbalized acceptance and understanding.  Flu Vaccine status: Declined, Education has been provided regarding the importance of this vaccine but patient still declined. Advised may receive this vaccine at local pharmacy or Health Dept. Aware to provide a copy of the vaccination record if obtained from local pharmacy or Health Dept. Verbalized acceptance and understanding.  Pneumococcal vaccine status: Up to date  Covid-19 vaccine status: Information provided on how to obtain vaccines.   Qualifies for Shingles Vaccine? Yes   Zostavax completed  unknown   Shingrix Completed?: Yes  Screening Tests Health Maintenance  Topic Date Due   DTaP/Tdap/Td (1 - Tdap) Never done   COLONOSCOPY (Pts 45-84yrs Insurance coverage will need to be confirmed)  Never done   DEXA SCAN  Never done   INFLUENZA VACCINE  Never done   COVID-19 Vaccine (4 - 2023-24 season) 03/06/2022   Medicare Annual Wellness (AWV)  09/28/2023   Pneumonia Vaccine 82+ Years old  Completed   Hepatitis C Screening  Completed   Zoster Vaccines- Shingrix  Completed   HPV VACCINES  Aged Out    Health Maintenance  Health Maintenance Due  Topic Date Due   DTaP/Tdap/Td (1 - Tdap) Never done   COLONOSCOPY (Pts 45-31yrs Insurance coverage will need to be confirmed)  Never done   DEXA SCAN  Never done   INFLUENZA VACCINE  Never done   COVID-19 Vaccine (4 - 2023-24 season) 03/06/2022    Colonoscopy - Declined under oncology care.  Mammogram - Declined  Bone Scan - Declined  Lung Cancer Screening: (Low Dose CT Chest recommended if Age 77-80 years, 30 pack-year currently smoking OR have quit w/in 15years.) does not qualify.   Lung Cancer Screening Referral: no  Additional Screening:  Hepatitis C Screening: does not qualify; Completed 09/02/18  Vision Screening: Recommended  annual ophthalmology exams for early detection of glaucoma and other disorders of the eye. Is the patient up to date with their annual eye exam?  Yes  Who is the provider or what is the name of the office in which the patient attends annual eye exams? Dr.Bell If pt is not established with a provider, would they like to be referred to a provider to establish care? No .   Dental Screening: Recommended annual dental exams for proper oral hygiene  Community Resource Referral / Chronic Care Management: CRR required this visit?  No   CCM required this visit?  No      Plan:     I have personally reviewed and noted the following in the patient's chart:   Medical and social history Use of alcohol, tobacco or illicit drugs  Current medications and supplements including opioid prescriptions. Patient is not currently taking opioid prescriptions. Functional ability and status Nutritional status Physical activity Advanced directives List of other physicians Hospitalizations, surgeries, and ER visits in previous 12 months Vitals Screenings to include cognitive, depression, and falls Referrals and appointments  In addition, I have reviewed and discussed with patient certain preventive protocols, quality metrics, and best practice recommendations. A written personalized care plan for preventive services as well as general preventive health recommendations were provided to patient.     Cyree Chuong Moody Bruins, LPN  09/28/2022   Nurse Notes: Pt declined mammogram, colonoscopy, and bone scan. Pt declined flu vaccine.

## 2022-09-28 NOTE — Patient Instructions (Signed)
Betty Sanchez , Thank you for taking time to come for your Medicare Wellness Visit. I appreciate your ongoing commitment to your health goals. Please review the following plan we discussed and let me know if I can assist you in the future.   These are the goals we discussed:  Goals      Patient Stated     Starting 09/02/2018, I will continue to exercise for 75 minutes twice weekly.      Patient Stated     09/12/2019, I will continue to walk 2 miles everyday.      Patient Stated     Would like to maintain current routine     Patient Stated     Lose 5-10 lbs, get 8000-10000 steps a day.        This is a list of the screening recommended for you and due dates:  Health Maintenance  Topic Date Due   DTaP/Tdap/Td vaccine (1 - Tdap) Never done   Colon Cancer Screening  Never done   DEXA scan (bone density measurement)  Never done   Flu Shot  Never done   COVID-19 Vaccine (4 - 2023-24 season) 03/06/2022   Medicare Annual Wellness Visit  09/28/2023   Pneumonia Vaccine  Completed   Hepatitis C Screening: USPSTF Recommendation to screen - Ages 18-79 yo.  Completed   Zoster (Shingles) Vaccine  Completed   HPV Vaccine  Aged Out    Advanced directives: Please bring a copy of your health care power of attorney and living will to the office to be added to your chart at your convenience.   Conditions/risks identified: none  Next appointment: Follow up in one year for your annual wellness visit 09/30/23 @ 9:30 telephone visit.   Preventive Care 61 Years and Older, Female Preventive care refers to lifestyle choices and visits with your health care provider that can promote health and wellness. What does preventive care include? A yearly physical exam. This is also called an annual well check. Dental exams once or twice a year. Routine eye exams. Ask your health care provider how often you should have your eyes checked. Personal lifestyle choices, including: Daily care of your teeth and  gums. Regular physical activity. Eating a healthy diet. Avoiding tobacco and drug use. Limiting alcohol use. Practicing safe sex. Taking low-dose aspirin every day. Taking vitamin and mineral supplements as recommended by your health care provider. What happens during an annual well check? The services and screenings done by your health care provider during your annual well check will depend on your age, overall health, lifestyle risk factors, and family history of disease. Counseling  Your health care provider may ask you questions about your: Alcohol use. Tobacco use. Drug use. Emotional well-being. Home and relationship well-being. Sexual activity. Eating habits. History of falls. Memory and ability to understand (cognition). Work and work Statistician. Reproductive health. Screening  You may have the following tests or measurements: Height, weight, and BMI. Blood pressure. Lipid and cholesterol levels. These may be checked every 5 years, or more frequently if you are over 39 years old. Skin check. Lung cancer screening. You may have this screening every year starting at age 19 if you have a 30-pack-year history of smoking and currently smoke or have quit within the past 15 years. Fecal occult blood test (FOBT) of the stool. You may have this test every year starting at age 34. Flexible sigmoidoscopy or colonoscopy. You may have a sigmoidoscopy every 5 years or a colonoscopy every  10 years starting at age 63. Hepatitis C blood test. Hepatitis B blood test. Sexually transmitted disease (STD) testing. Diabetes screening. This is done by checking your blood sugar (glucose) after you have not eaten for a while (fasting). You may have this done every 1-3 years. Bone density scan. This is done to screen for osteoporosis. You may have this done starting at age 24. Mammogram. This may be done every 1-2 years. Talk to your health care provider about how often you should have regular  mammograms. Talk with your health care provider about your test results, treatment options, and if necessary, the need for more tests. Vaccines  Your health care provider may recommend certain vaccines, such as: Influenza vaccine. This is recommended every year. Tetanus, diphtheria, and acellular pertussis (Tdap, Td) vaccine. You may need a Td booster every 10 years. Zoster vaccine. You may need this after age 47. Pneumococcal 13-valent conjugate (PCV13) vaccine. One dose is recommended after age 55. Pneumococcal polysaccharide (PPSV23) vaccine. One dose is recommended after age 42. Talk to your health care provider about which screenings and vaccines you need and how often you need them. This information is not intended to replace advice given to you by your health care provider. Make sure you discuss any questions you have with your health care provider. Document Released: 07/19/2015 Document Revised: 03/11/2016 Document Reviewed: 04/23/2015 Elsevier Interactive Patient Education  2017 [place] Prevention in the Home Falls can cause injuries. They can happen to people of all ages. There are many things you can do to make your home safe and to help prevent falls. What can I do on the outside of my home? Regularly fix the edges of walkways and driveways and fix any cracks. Remove anything that might make you trip as you walk through a door, such as a raised step or threshold. Trim any bushes or trees on the path to your home. Use bright outdoor lighting. Clear any walking paths of anything that might make someone trip, such as rocks or tools. Regularly check to see if handrails are loose or broken. Make sure that both sides of any steps have handrails. Any raised decks and porches should have guardrails on the edges. Have any leaves, snow, or ice cleared regularly. Use sand or salt on walking paths during winter. Clean up any spills in your garage right away. This includes oil  or grease spills. What can I do in the bathroom? Use night lights. Install grab bars by the toilet and in the tub and shower. Do not use towel bars as grab bars. Use non-skid mats or decals in the tub or shower. If you need to sit down in the shower, use a plastic, non-slip stool. Keep the floor dry. Clean up any water that spills on the floor as soon as it happens. Remove soap buildup in the tub or shower regularly. Attach bath mats securely with double-sided non-slip rug tape. Do not have throw rugs and other things on the floor that can make you trip. What can I do in the bedroom? Use night lights. Make sure that you have a light by your bed that is easy to reach. Do not use any sheets or blankets that are too big for your bed. They should not hang down onto the floor. Have a firm chair that has side arms. You can use this for support while you get dressed. Do not have throw rugs and other things on the floor that can make you  trip. What can I do in the kitchen? Clean up any spills right away. Avoid walking on wet floors. Keep items that you use a lot in easy-to-reach places. If you need to reach something above you, use a strong step stool that has a grab bar. Keep electrical cords out of the way. Do not use floor polish or wax that makes floors slippery. If you must use wax, use non-skid floor wax. Do not have throw rugs and other things on the floor that can make you trip. What can I do with my stairs? Do not leave any items on the stairs. Make sure that there are handrails on both sides of the stairs and use them. Fix handrails that are broken or loose. Make sure that handrails are as long as the stairways. Check any carpeting to make sure that it is firmly attached to the stairs. Fix any carpet that is loose or worn. Avoid having throw rugs at the top or bottom of the stairs. If you do have throw rugs, attach them to the floor with carpet tape. Make sure that you have a light  switch at the top of the stairs and the bottom of the stairs. If you do not have them, ask someone to add them for you. What else can I do to help prevent falls? Wear shoes that: Do not have high heels. Have rubber bottoms. Are comfortable and fit you well. Are closed at the toe. Do not wear sandals. If you use a stepladder: Make sure that it is fully opened. Do not climb a closed stepladder. Make sure that both sides of the stepladder are locked into place. Ask someone to hold it for you, if possible. Clearly mark and make sure that you can see: Any grab bars or handrails. First and last steps. Where the edge of each step is. Use tools that help you move around (mobility aids) if they are needed. These include: Canes. Walkers. Scooters. Crutches. Turn on the lights when you go into a dark area. Replace any light bulbs as soon as they burn out. Set up your furniture so you have a clear path. Avoid moving your furniture around. If any of your floors are uneven, fix them. If there are any pets around you, be aware of where they are. Review your medicines with your doctor. Some medicines can make you feel dizzy. This can increase your chance of falling. Ask your doctor what other things that you can do to help prevent falls. This information is not intended to replace advice given to you by your health care provider. Make sure you discuss any questions you have with your health care provider. Document Released: 04/18/2009 Document Revised: 11/28/2015 Document Reviewed: 07/27/2014 Elsevier Interactive Patient Education  2017 [employer].

## 2022-10-01 ENCOUNTER — Other Ambulatory Visit (INDEPENDENT_AMBULATORY_CARE_PROVIDER_SITE_OTHER): Payer: Medicare PPO

## 2022-10-01 DIAGNOSIS — I10 Essential (primary) hypertension: Secondary | ICD-10-CM

## 2022-10-01 DIAGNOSIS — E038 Other specified hypothyroidism: Secondary | ICD-10-CM

## 2022-10-01 DIAGNOSIS — E7800 Pure hypercholesterolemia, unspecified: Secondary | ICD-10-CM | POA: Diagnosis not present

## 2022-10-01 LAB — TSH: TSH: 9.89 u[IU]/mL — ABNORMAL HIGH (ref 0.35–5.50)

## 2022-10-01 LAB — LIPID PANEL
Cholesterol: 244 mg/dL — ABNORMAL HIGH (ref 0–200)
HDL: 66 mg/dL (ref >39.00)
LDL Cholesterol: 152 mg/dL — ABNORMAL HIGH (ref 0–99)
NonHDL: 177.62
Total CHOL/HDL Ratio: 4
Triglycerides: 130 mg/dL (ref 0.0–149.0)
VLDL: 26 mg/dL (ref 0.0–40.0)

## 2022-10-01 LAB — COMPREHENSIVE METABOLIC PANEL
ALT: 14 U/L (ref 0–35)
AST: 18 U/L (ref 0–37)
Albumin: 4.3 g/dL (ref 3.5–5.2)
Alkaline Phosphatase: 62 U/L (ref 39–117)
BUN: 11 mg/dL (ref 6–23)
CO2: 24 mEq/L (ref 19–32)
Calcium: 9.6 mg/dL (ref 8.4–10.5)
Chloride: 106 mEq/L (ref 96–112)
Creatinine, Ser: 0.65 mg/dL (ref 0.40–1.20)
GFR: 86.23 mL/min (ref >60.00)
Glucose, Bld: 101 mg/dL — ABNORMAL HIGH (ref 70–99)
Potassium: 4.1 mEq/L (ref 3.5–5.1)
Sodium: 139 mEq/L (ref 135–145)
Total Bilirubin: 0.6 mg/dL (ref 0.2–1.2)
Total Protein: 7.6 g/dL (ref 6.0–8.3)

## 2022-10-01 LAB — T4, FREE: Free T4: 0.75 ng/dL (ref 0.60–1.60)

## 2022-10-01 LAB — T3, FREE: T3, Free: 3 pg/mL (ref 2.3–4.2)

## 2022-10-01 NOTE — Progress Notes (Signed)
No critical labs need to be addressed urgently. We will discuss labs in detail at upcoming office visit.   

## 2022-10-02 ENCOUNTER — Other Ambulatory Visit: Payer: Medicare PPO

## 2022-10-06 ENCOUNTER — Encounter: Payer: Medicare PPO | Admitting: Family Medicine

## 2022-10-09 ENCOUNTER — Ambulatory Visit (INDEPENDENT_AMBULATORY_CARE_PROVIDER_SITE_OTHER): Payer: Medicare PPO | Admitting: Family Medicine

## 2022-10-09 ENCOUNTER — Encounter: Payer: Self-pay | Admitting: Family Medicine

## 2022-10-09 VITALS — BP 138/78 | HR 113 | Temp 97.7°F | Ht 64.5 in | Wt 177.2 lb

## 2022-10-09 DIAGNOSIS — Z0000 Encounter for general adult medical examination without abnormal findings: Secondary | ICD-10-CM | POA: Diagnosis not present

## 2022-10-09 DIAGNOSIS — I10 Essential (primary) hypertension: Secondary | ICD-10-CM

## 2022-10-09 DIAGNOSIS — E038 Other specified hypothyroidism: Secondary | ICD-10-CM

## 2022-10-09 DIAGNOSIS — E7800 Pure hypercholesterolemia, unspecified: Secondary | ICD-10-CM

## 2022-10-09 NOTE — Assessment & Plan Note (Addendum)
Chronic, LDL not at goal  She continues to not be interested in statin treatment. We did discuss calcium cardiac scoring as an option to determine her risk more specifically for coronary artery disease. She will consider this and let me know.  Will continue to work on healthy lifestyle changes

## 2022-10-09 NOTE — Assessment & Plan Note (Signed)
Chronic , stable control on  No medicaiton. Free T3 and free T4 remain in normal range.

## 2022-10-09 NOTE — Assessment & Plan Note (Signed)
Stable, chronic.  Continue current medication. ? ? ? amlodipine 10 daily ?

## 2022-10-09 NOTE — Progress Notes (Signed)
Patient ID: Betty Sanchez, female    DOB: [birth year], 78 y.o.   MRN: [id number]  This visit was conducted in person.  BP 138/78   Pulse (!) 113   Temp 97.7 F (36.5 C) (Temporal)   Ht 5' 4.5" (1.638 m)   Wt 177 lb 4 oz (80.4 kg)   SpO2 97%   BMI 29.96 kg/m    CC: Chief Complaint  Patient presents with   Annual Exam    Part 2 (MWV 09/28/2022)    Subjective:   HPI: Dustine Sanchez is a 77 y.o. female presenting on 10/09/2022 for Annual Exam (Part 2 (MWV 09/28/2022))  The patient presents for complete physical and review of chronic health problems. He/She also has the following acute concerns today: none  The patient saw a LPN or RN for medicare wellness visit. ON 09/28/22  Prevention and wellness was reviewed in detail. Note reviewed and important notes copied below.  Hx of endometrial cancer S/P TRH/SBO 01/2020. She opted against radiation followed by active surveillance by Dr. Pricilla Holm at [facility] q6 months  Elevated Cholesterol: Inadequate control of cholesterol,On natural cholesterol medication treatment. Statin is indicated Lab Results  Component Value Date   CHOL 244 (H) 10/01/2022   HDL 66.00 10/01/2022   LDLCALC 152 (H) 10/01/2022   TRIG 130.0 10/01/2022   CHOLHDL 4 10/01/2022  The 10-year ASCVD risk score (Arnett DK, et al., 2019) is: 24.5%   Values used to calculate the score:     Age: 61 years     Sex: Female     Is Non-Hispanic African American: No     Diabetic: No     Tobacco smoker: No     Systolic Blood Pressure: 138 mmHg     Is BP treated: Yes     HDL Cholesterol: 66 mg/dL     Total Cholesterol: 244 mg/dL Using medications without problems: Muscle aches:  Diet compliance:  Heart healthy Exercise: 7000-10000 steps a day, weight training, upper body Other complaints:  Hypertension: Well-controlled on Norvasc 10 mg daily BP Readings from Last 3 Encounters:  10/09/22 138/78  08/07/22 (!) 152/84  12/15/21 (!) 148/87  Using medication without problems or  lightheadedness:  none Chest pain with exertion: none Edema:none Short of breath:none Average home BPs: 112-132/72-83 Other issues:   Subclinical hypothyroidism: TSH elevated but free T3 and free T4 in normal range.  She states felt  worse on levothyroxine in the past. Lab Results  Component Value Date   TSH 9.89 (H) 10/01/2022    She has been working on low carb diet  Wt Readings from Last 3 Encounters:  10/09/22 177 lb 4 oz (80.4 kg)  09/28/22 174 lb (78.9 kg)  08/07/22 175 lb 4.8 oz (79.5 kg)      Followed by [facility] for endometrial cancer.  Relevant past medical, surgical, family and social history reviewed and updated as indicated. Interim medical history since our last visit reviewed. Allergies and medications reviewed and updated. Outpatient Medications Prior to Visit  Medication Sig Dispense Refill   amLODipine (NORVASC) 10 MG tablet TAKE 1 TABLET(10 MG) BY MOUTH DAILY 90 tablet 0   No facility-administered medications prior to visit.     Per HPI unless specifically indicated in ROS section below Review of Systems  Constitutional:  Negative for fatigue and fever.  HENT:  Negative for congestion.   Eyes:  Negative for pain.  Respiratory:  Negative for cough and shortness of breath.   Cardiovascular:  Negative for chest  pain, palpitations and leg swelling.  Gastrointestinal:  Negative for abdominal pain.  Genitourinary:  Negative for dysuria and vaginal bleeding.  Musculoskeletal:  Negative for back pain.  Neurological:  Negative for syncope, light-headedness and headaches.  Psychiatric/Behavioral:  Negative for dysphoric mood.    Objective:  BP 138/78   Pulse (!) 113   Temp 97.7 F (36.5 C) (Temporal)   Ht 5' 4.5" (1.638 m)   Wt 177 lb 4 oz (80.4 kg)   SpO2 97%   BMI 29.96 kg/m   Wt Readings from Last 3 Encounters:  10/09/22 177 lb 4 oz (80.4 kg)  09/28/22 174 lb (78.9 kg)  08/07/22 175 lb 4.8 oz (79.5 kg)      Physical Exam Constitutional:      General:  She is not in acute distress.    Appearance: Normal appearance. She is well-developed. She is not ill-appearing or toxic-appearing.  HENT:     Head: Normocephalic.     Right Ear: Hearing, tympanic membrane, ear canal and external ear normal. Tympanic membrane is not erythematous, retracted or bulging.     Left Ear: Hearing, tympanic membrane, ear canal and external ear normal. Tympanic membrane is not erythematous, retracted or bulging.     Nose: No mucosal edema or rhinorrhea.     Right Sinus: No maxillary sinus tenderness or frontal sinus tenderness.     Left Sinus: No maxillary sinus tenderness or frontal sinus tenderness.     Mouth/Throat:     Pharynx: Uvula midline.  Eyes:     General: Lids are normal. Lids are everted, no foreign bodies appreciated.     Conjunctiva/sclera: Conjunctivae normal.     Pupils: Pupils are equal, round, and reactive to light.  Neck:     Thyroid: No thyroid mass or thyromegaly.     Vascular: No carotid bruit.     Trachea: Trachea normal.  Cardiovascular:     Rate and Rhythm: Normal rate and regular rhythm.     Pulses: Normal pulses.     Heart sounds: Normal heart sounds, S1 normal and S2 normal. No murmur heard.    No friction rub. No gallop.  Pulmonary:     Effort: Pulmonary effort is normal. No tachypnea or respiratory distress.     Breath sounds: Normal breath sounds. No decreased breath sounds, wheezing, rhonchi or rales.  Abdominal:     General: Bowel sounds are normal.     Palpations: Abdomen is soft.     Tenderness: There is no abdominal tenderness.  Musculoskeletal:     Cervical back: Normal range of motion and neck supple.  Skin:    General: Skin is warm and dry.     Findings: No rash.  Neurological:     Mental Status: She is alert.  Psychiatric:        Mood and Affect: Mood is not anxious or depressed.        Speech: Speech normal.        Behavior: Behavior normal. Behavior is cooperative.        Thought Content: Thought content  normal.        Judgment: Judgment normal.       Results for orders placed or performed in visit on 10/01/22  T3, Free  Result Value Ref Range   T3, Free 3.0 2.3 - 4.2 pg/mL  T4, free  Result Value Ref Range   Free T4 0.75 0.60 - 1.60 ng/dL  TSH  Result Value Ref Range   TSH 9.89 (  H) 0.35 - 5.50 uIU/mL  Lipid panel  Result Value Ref Range   Cholesterol 244 (H) 0 - 200 mg/dL   Triglycerides 161.0130.0 0.0 - 149.0 mg/dL   HDL 96.0466.00 >54.09>39.00 mg/dL   VLDL 81.126.0 0.0 - 91.440.0 mg/dL   LDL Cholesterol 782152 (H) 0 - 99 mg/dL   Total CHOL/HDL Ratio 4    NonHDL 177.62   Comprehensive metabolic panel  Result Value Ref Range   Sodium 139 135 - 145 mEq/L   Potassium 4.1 3.5 - 5.1 mEq/L   Chloride 106 96 - 112 mEq/L   CO2 24 19 - 32 mEq/L   Glucose, Bld 101 (H) 70 - 99 mg/dL   BUN 11 6 - 23 mg/dL   Creatinine, Ser 9.560.65 0.40 - 1.20 mg/dL   Total Bilirubin 0.6 0.2 - 1.2 mg/dL   Alkaline Phosphatase 62 39 - 117 U/L   AST 18 0 - 37 U/L   ALT 14 0 - 35 U/L   Total Protein 7.6 6.0 - 8.3 g/dL   Albumin 4.3 3.5 - 5.2 g/dL   GFR 21.3086.23 >86.57>60.00 mL/min   Calcium 9.6 8.4 - 10.5 mg/dL    This visit occurred during the SARS-CoV-2 public health emergency.  Safety protocols were in place, including screening questions prior to the visit, additional usage of staff PPE, and extensive cleaning of exam room while observing appropriate contact time as indicated for disinfecting solutions.   COVID 19 screen:  No recent travel or known exposure to COVID19 The patient denies respiratory symptoms of COVID 19 at this time. The importance of social distancing was discussed today.   Assessment and Plan The patient's preventative maintenance and recommended screening tests for an annual wellness exam were reviewed in full today. Brought up to date unless services declined.  Counselled on the importance of diet, exercise, and its role in overall health and mortality. The patient's FH and SH was reviewed, including their  home life, tobacco status, and drug and alcohol status.    Vaccines: Has had 3 COVID doses,  pneumonia series Consider  Tetanus Pap/DVE: Status post total hysterectomy Mammo: Overdue,  not interested Bone Density: Overdue not interested Colon: Never had, but no longer indicated at age 77. Smoking Status: None ETOH/ drug use: occ/none  Hep C: Done    Problem List Items Addressed This Visit     Essential hypertension    Stable, chronic.  Continue current medication.    amlodipine 10 daily      Hypercholesteremia    Chronic, LDL not at goal  She continues to not be interested in statin treatment. We did discuss calcium cardiac scoring as an option to determine her risk more specifically for coronary artery disease. She will consider this and let me know.  Will continue to work on healthy lifestyle changes      Subclinical hypothyroidism    Chronic , stable control on  No medicaiton. Free T3 and free T4 remain in normal range.      Other Visit Diagnoses     Routine general medical examination at a health care facility    -  Primary      Kerby NoraAmy Drevon Plog, MD

## 2022-12-07 ENCOUNTER — Other Ambulatory Visit: Payer: Self-pay | Admitting: Family Medicine

## 2022-12-07 DIAGNOSIS — I10 Essential (primary) hypertension: Secondary | ICD-10-CM

## 2023-02-03 ENCOUNTER — Telehealth: Payer: Self-pay

## 2023-02-03 NOTE — Telephone Encounter (Signed)
Pt called office stating she needs to schedule a follow up appointment with Dr. Pricilla Holm. Last appointment was 08/2022. Pt states she can not do August d/t traveling.   Appointment scheduled for October 11, at 8:45.  Pt agreed to date/time

## 2023-04-15 NOTE — Progress Notes (Signed)
Gynecologic Oncology Return Clinic Visit  04/16/23  Reason for Visit: follow-up, surveillance  Treatment History: Oncology History Overview Note  IHC MMR normal MSI-stable   History of endometrial cancer  10/26/2019 Initial Biopsy   EMB: A. ENDOMETRIUM, BIOPSY:  - Scant benign endocervical glandular type epithelium present in the  background of a majority of blood.  - No distinct endometrial tissue present for evaluation.    12/18/2019 Surgery   D&C: A. ENDOMETRIUM, CURETTAGE:  -  Benign squamous and endocervical glandular epithelium with  microglandular adenosis  -  Benign lower uterine segment  -  No malignancy identified  -  See comment    01/10/2020 Surgery   Hysteroscopic endometrial mass resection: A. ENDOMETRIUM, RESECTION:  - Complex atypical hyperplasia, at least partially involving an  endometrial polyp.   01/24/2020 Surgery   TRH/BSO, SLN injection, bilateral pelvic LND  On EUA, small mobile uterus. On itnra-abdominal entry, normal upper abdominal survey, stomach, small and large bowel, and omentum. Normal appearing bilateral adnexa. Uterus 8cm. Hemosiderin staining along left pelvis c/w history of endometriosis. Mapping to channels that met on the right external iliac artery - no clearly lymphatic tissue. Mapping along the broad ligament on the left without obvious SLN.  No intra-abdominal or pelvic evidence of disease.    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            Tumor invades more than one half of the myometrium.            Leiomyoma.            See oncology table.       Serosa: Unremarkable. No malignancy.  - Cervix: Benign squamous and endocervical mucosa. No dysplasia or  malignancy.  - Bilateral ovaries: Benign serous cystadenoma. No malignancy.  -  Bilateral fallopian tubes: Adhesions. No malignancy.   B. SOFT TISSUE, RIGHT EXTERNAL ILIAC, BIOPSY:  - Benign soft tissue.  - No lymphoid tissue identified.   C. LYMPH NODES, RIGHT PELVIC, REGIONAL SECTION:  - Six of six lymph nodes negative for malignancy (0/6).   D. LYMPH NODES, LEFT PELVIC, REGIONAL SECTION:  - Nine of nine lymph nodes negative for malignancy (0/9).    ONCOLOGY TABLE:   UTERUS, CARCINOMA OR CARCINOSARCOMA   Procedure: Total hysterectomy and bilateral salpingo-oophorectomy with  lymph node resections  Histologic type: Endometrioid adenocarcinoma  Histologic Grade: FIGO grade I  Myometrial invasion:       Depth of invasion: 8 mm       Myometrial thickness: 12 mm  Uterine Serosa Involvement: Not identified  Cervical stromal involvement: Not identified  Extent of involvement of other organs: Not identified  Lymphovascular invasion: Not identified  Regional Lymph Nodes:       Examined:       0 Sentinel                               15 non-sentinel                               15 total        Lymph nodes with metastasis: 0        Isolated tumor cells (<  0.2 mm): 0        Micrometastasis:  (>0.2 mm and < 2.0 mm): 0        Macrometastasis: (>2.0 mm): 0        Extracapsular extension: Not applicable  Representative Tumor Block: A5  MMR / MSI testing: Will be ordered    01/24/2020 Pathologic Stage   Stage IB, grade 1 No LVSI, negative LNDs   01/24/2020 Cancer Staging   Staging form: Corpus Uteri - Carcinoma and Carcinosarcoma, AJCC 8th Edition - Clinical stage from 01/24/2020: FIGO Stage IB (cT1b, cN0, cM0) - Signed by Carver Fila, MD on 01/31/2020     Interval History: Doing well.  Denies any vaginal bleeding or discharge.  Denies abdominal or pelvic pain.  Reports baseline bowel bladder function.  Past Medical/Surgical History: Past Medical History:  Diagnosis Date   Abnormal uterine bleeding (AUB)    Anxiety    very anxious in a medical  setting   Cataract 2019   bilateral eyes; corrected with surgery   Fall 12/2015   multiple falls in week    Hypertension    Hypothyroidism     Past Surgical History:  Procedure Laterality Date   APPENDECTOMY  age 73   CATARACT EXTRACTION W/ INTRAOCULAR LENS  IMPLANT, BILATERAL  2019   DILATATION & CURETTAGE/HYSTEROSCOPY WITH MYOSURE N/A 01/10/2020   Procedure: Jackquline Denmark WITH ENDOMETRIAL SAMPLING;  Surgeon: Carver Fila, MD;  Location: [facility];  Service: Gynecology;  Laterality: N/A;   HYSTEROSCOPY WITH D & C N/A 12/18/2019   Procedure: DILATATION AND CURETTAGE /HYSTEROSCOPY;  Surgeon: Adam Phenix, MD;  Location: [facility];  Service: Gynecology;  Laterality: N/A;   ROBOTIC ASSISTED TOTAL HYSTERECTOMY WITH BILATERAL SALPINGO OOPHERECTOMY N/A 01/24/2020   Procedure: XI ROBOTIC ASSISTED TOTAL HYSTERECTOMY WITH BILATERAL SALPINGO OOPHORECTOMY;  Surgeon: Carver Fila, MD;  Location: [facility];  Service: Gynecology;  Laterality: N/A;   SENTINEL NODE BIOPSY N/A 01/24/2020   Procedure: SENTINEL NODE INJECTION BIOPSY, BILATERAL PELVIC LYMPH NODE DISSECTION;  Surgeon: Carver Fila, MD;  Location: [facility] ;  Service: Gynecology;  Laterality: N/A;   TONSILLECTOMY  age 89    Family History  Problem Relation Age of Onset   Breast cancer Neg Hx    Ovarian cancer Neg Hx    Endometrial cancer Neg Hx    Colon cancer Neg Hx     Social History   Socioeconomic History   Marital status: Single    Spouse name: Not on file   Number of children: 0   Years of education: 17   Highest education level: Not on file  Occupational History   Not on file  Tobacco Use   Smoking status: Never   Smokeless tobacco: Never  Vaping Use   Vaping status: Never Used  Substance and Sexual Activity   Alcohol use: Never    Alcohol/week: 1.0 standard drink of alcohol    Types: 1 Glasses of wine per week   Drug use: Never    Sexual activity: Not Currently  Other Topics Concern   Not on file  Social History Narrative   Not on file   Social Determinants of Health   Financial Resource Strain: Low Risk  (09/28/2022)   Overall Financial Resource Strain (CARDIA)    Difficulty of Paying Living Expenses: Not hard at all  Food Insecurity: No Food Insecurity (09/28/2022)   Hunger Vital Sign    Worried About Running Out of  Food in the Last Year: Never true    Ran Out of Food in the Last Year: Never true  Transportation Needs: No Transportation Needs (09/28/2022)   PRAPARE - Administrator, Civil Service (Medical): No    Lack of Transportation (Non-Medical): No  Physical Activity: Sufficiently Active (09/28/2022)   Exercise Vital Sign    Days of Exercise per Week: 5 days    Minutes of Exercise per Session: 60 min  Stress: No Stress Concern Present (09/28/2022)   [employer] of Occupational Health - Occupational Stress Questionnaire    Feeling of Stress : Not at all  Social Connections: Moderately Integrated (09/28/2022)   Social Connection and Isolation Panel [NHANES]    Frequency of Communication with Friends and Family: More than three times a week    Frequency of Social Gatherings with Friends and Family: More than three times a week    Attends Religious Services: More than 4 times per year    Active Member of [employer] or Organizations: Yes    Attends Engineer, structural: More than 4 times per year    Marital Status: Never married    Current Medications:  Current Outpatient Medications:    amLODipine (NORVASC) 10 MG tablet, TAKE 1 TABLET(10 MG) BY MOUTH DAILY, Disp: 90 tablet, Rfl: 3  Review of Systems: Denies appetite changes, fevers, chills, fatigue, unexplained weight changes. Denies hearing loss, neck lumps or masses, mouth sores, ringing in ears or voice changes. Denies cough or wheezing.  Denies shortness of breath. Denies chest pain or palpitations. Denies leg  swelling. Denies abdominal distention, pain, blood in stools, constipation, diarrhea, nausea, vomiting, or early satiety. Denies pain with intercourse, dysuria, frequency, hematuria or incontinence. Denies hot flashes, pelvic pain, vaginal bleeding or vaginal discharge.   Denies joint pain, back pain or muscle pain/cramps. Denies itching, rash, or wounds. Denies dizziness, headaches, numbness or seizures. Denies swollen lymph nodes or glands, denies easy bruising or bleeding. Denies anxiety, depression, confusion, or decreased concentration.  Physical Exam: BP (!) 167/72 (BP Location: Right Arm) Comment: notified RN  Pulse (!) 118 Comment: Notified RN  Temp 97.9 F (36.6 C) (Oral)   Resp 20   Ht 5\' 6"  (1.676 m)   Wt 179 lb 12.8 oz (81.6 kg)   SpO2 100%   BMI 29.02 kg/m  General: Alert, oriented, no acute distress. HEENT: Normocephalic, atraumatic, sclera anicteric. Chest: Clear to auscultation bilaterally.  No wheezes or rhonchi. Cardiovascular: Regular rate and rhythm, no murmurs. Abdomen: soft, nontender.  Normoactive bowel sounds.  No masses or hepatosplenomegaly appreciated.  Well-healed incisions. Extremities: Grossly normal range of motion.  Warm, well perfused.  No edema bilaterally. Skin: No rashes or lesions noted. Lymphatics: No cervical, supraclavicular, or inguinal adenopathy. GU: Normal appearing external genitalia without erythema, excoriation, or lesions. Speculum exam reveals moderately atrophic vaginal mucosa, no bleeding, discharge, or masses noted.  Along the cuff and left sidewall, there is some more prominent almost glandular appearing tissue, which has changed some since her last visit especially along the anterior vagina at the cuff.  Cuff intact.  Bimanual exam reveals cuff intact, no masses or nodularity.  Rectovaginal exam confirms findings.  Vaginal cuff biopsy Preoperative diagnosis: History of endometrial cancer, visual changes of the vaginal mucosa  sent Postoperative diagnosis: Same as above Specimen: Vaginal biopsy, left sidewall/posterior cuff Estimated blood loss: Less than 5 cc Procedure: After the procedure was discussed with the patient, she gave verbal consent.  She was already in dorsolithotomy  position with a speculum in the vagina.  The area to be biopsied was cleansed with Betadine x3.  Tischler forceps were then used to take a biopsy of the vaginal wall.  This was placed in formalin.  There was more significant bleeding than expected with approximately 50 cc of relatively quick blood loss.  The vagina was packed with quick clot gauze and left in place for at least 15 minutes.  The area was again examined and noted to be oozing very minimally.  Monsel's was used to consolidate hemostasis.  No active bleeding noted after placement of Monsel's.  Gentle pelvic exam was then done after all instruments were removed from the vagina.  Overall she tolerated the procedure well.  The patient sit and walk around for approximately 15 minutes.  No blood was noted on her pad.  Laboratory & Radiologic Studies: None new  Assessment & Plan: Betty Sanchez is a 77 y.o. 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 S/p definitive surgery 01/2020. MMR intact, MS stable.   Betty Sanchez continues to do very well.  Biopsy taken along the anterior left vaginal cuff due to changes on her exam.  Although the tissue was smooth with an overall low concern for recurrence, it bled much more than I expected.  This required the use of quick clot gauze Monsel's.   NCCN surveillance guidelines recommend visits every 6 months for 5 years. Patient is aware of signs and symptoms to call if she develops between visits.  25 minutes of total time was spent for this patient encounter, including preparation, face-to-face counseling with the patient and coordination of care, and documentation of the encounter.  Betty Garnet, MD  Division of Gynecologic Oncology  Department of Obstetrics and Gynecology  [facility]

## 2023-04-16 ENCOUNTER — Inpatient Hospital Stay: Payer: Medicare PPO | Attending: Gynecologic Oncology | Admitting: Gynecologic Oncology

## 2023-04-16 ENCOUNTER — Encounter: Payer: Self-pay | Admitting: Gynecologic Oncology

## 2023-04-16 ENCOUNTER — Other Ambulatory Visit: Payer: Self-pay

## 2023-04-16 VITALS — BP 142/82 | HR 96 | Temp 97.9°F | Resp 20 | Ht 66.0 in | Wt 179.8 lb

## 2023-04-16 DIAGNOSIS — N898 Other specified noninflammatory disorders of vagina: Secondary | ICD-10-CM | POA: Insufficient documentation

## 2023-04-16 DIAGNOSIS — Z90710 Acquired absence of both cervix and uterus: Secondary | ICD-10-CM | POA: Insufficient documentation

## 2023-04-16 DIAGNOSIS — C541 Malignant neoplasm of endometrium: Secondary | ICD-10-CM

## 2023-04-16 DIAGNOSIS — Z08 Encounter for follow-up examination after completed treatment for malignant neoplasm: Secondary | ICD-10-CM | POA: Diagnosis not present

## 2023-04-16 DIAGNOSIS — Z8542 Personal history of malignant neoplasm of other parts of uterus: Secondary | ICD-10-CM | POA: Insufficient documentation

## 2023-04-16 DIAGNOSIS — Z90722 Acquired absence of ovaries, bilateral: Secondary | ICD-10-CM | POA: Diagnosis not present

## 2023-04-16 DIAGNOSIS — N761 Subacute and chronic vaginitis: Secondary | ICD-10-CM | POA: Diagnosis not present

## 2023-04-16 NOTE — Patient Instructions (Signed)
It was good to see you today.  I we will call you with your biopsy results.  They should be back early next week.  Will tentatively plan on a 2-month follow-up visit unless something comes back on the biopsy.  As always, if you develop any new and concerning symptoms before your next visit, please call to see me sooner.

## 2023-04-20 LAB — SURGICAL PATHOLOGY

## 2023-09-24 ENCOUNTER — Telehealth: Payer: Self-pay | Admitting: *Deleted

## 2023-09-24 DIAGNOSIS — E7800 Pure hypercholesterolemia, unspecified: Secondary | ICD-10-CM

## 2023-09-24 DIAGNOSIS — E038 Other specified hypothyroidism: Secondary | ICD-10-CM

## 2023-09-24 NOTE — Telephone Encounter (Signed)
-----   Message from Alvina Chou sent at 09/24/2023  9:59 AM EDT ----- Regarding: Lab orders for Mon, 4.7.25 Patient is scheduled for CPX labs, please order future labs, Thanks , Camelia Eng

## 2023-09-30 ENCOUNTER — Ambulatory Visit: Payer: Medicare PPO

## 2023-09-30 VITALS — Ht 66.0 in | Wt 179.0 lb

## 2023-09-30 DIAGNOSIS — Z0000 Encounter for general adult medical examination without abnormal findings: Secondary | ICD-10-CM | POA: Diagnosis not present

## 2023-09-30 NOTE — Patient Instructions (Signed)
 Betty Sanchez , Thank you for taking time to come for your Medicare Wellness Visit. I appreciate your ongoing commitment to your health goals. Please review the following plan we discussed and let me know if I can assist you in the future.   Referrals/Orders/Follow-Ups/Clinician Recommendations: none  This is a list of the screening recommended for you and due dates:  Health Maintenance  Topic Date Due   DTaP/Tdap/Td vaccine (1 - Tdap) Never done   Flu Shot  Never done   COVID-19 Vaccine (4 - 2024-25 season) 03/07/2023   DEXA scan (bone density measurement)  10/09/2023*   Medicare Annual Wellness Visit  09/29/2024   Pneumonia Vaccine  Completed   Hepatitis C Screening  Completed   Zoster (Shingles) Vaccine  Completed   HPV Vaccine  Aged Out  *Topic was postponed. The date shown is not the original due date.    Advanced directives: (Copy Requested) Please bring a copy of your health care power of attorney and living will to the office to be added to your chart at your convenience. You can mail to [facility] [address] [place], [place] [address] or email to [email or web address]  Next Medicare Annual Wellness Visit scheduled for next year: Yes 10/04/23 @ 10:10am televisit

## 2023-09-30 NOTE — Progress Notes (Signed)
 Subjective:   Betty Sanchez is a 78 y.o. who presents for a Medicare Wellness preventive visit.  Visit Complete: Virtual I connected with  Henna Albano on 09/30/23 by a audio enabled telemedicine application and verified that I am speaking with the correct person using two identifiers.  Patient Location: Home  Provider Location: Office/Clinic  I discussed the limitations of evaluation and management by telemedicine. The patient expressed understanding and agreed to proceed.  Vital Signs: Because this visit was a virtual/telehealth visit, some criteria may be missing or patient reported. Any vitals not documented were not able to be obtained and vitals that have been documented are patient reported.  VideoDeclined- This patient declined Librarian, academic. Therefore the visit was completed with audio only.  Persons Participating in Visit: Patient.  AWV Questionnaire: Yes: Patient Medicare AWV questionnaire was completed by the patient on 09/30/23; I have confirmed that all information answered by patient is correct and no changes since this date.  Cardiac Risk Factors include: advanced age (>72men, >60 women);hypertension     Objective:    Today's Vitals   09/30/23 1013  Weight: 179 lb (81.2 kg)  Height: 5\' 6"  (1.676 m)   Body mass index is 28.89 kg/m.     09/30/2023   10:27 AM 09/28/2022    2:36 PM 12/10/2021    9:59 AM 09/05/2021   11:59 AM 06/05/2021    1:23 PM 05/21/2020   12:28 PM 01/24/2020   11:41 AM  Advanced Directives  Does Patient Have a Medical Advance Directive? Yes Yes Yes Yes Yes Yes Yes  Type of Estate agent of [place];Living will Healthcare Power of [place];Living will  Healthcare Power of [place];Living will Healthcare Power of [place];Living will Healthcare Power of [place];Living will Healthcare Power of [place];Living will  Does patient want to make changes to medical advance directive?   No - Patient  declined Yes (MAU/Ambulatory/Procedural Areas - Information given) No - Patient declined    Copy of Healthcare Power of Attorney in Chart? Yes - validated most recent copy scanned in chart (See row information) No - copy requested    -- No - copy requested    Current Medications (verified) Outpatient Encounter Medications as of 09/30/2023  Medication Sig   amLODipine (NORVASC) 10 MG tablet TAKE 1 TABLET(10 MG) BY MOUTH DAILY   No facility-administered encounter medications on file as of 09/30/2023.    Allergies (verified) Patient has no known allergies.   History: Past Medical History:  Diagnosis Date   Abnormal uterine bleeding (AUB)    Anxiety    very anxious in a medical setting   Cataract 2019   bilateral eyes; corrected with surgery   Fall 12/2015   multiple falls in week    Hypertension    Hypothyroidism    Past Surgical History:  Procedure Laterality Date   APPENDECTOMY  age 37   CATARACT EXTRACTION W/ INTRAOCULAR LENS  IMPLANT, BILATERAL  2019   DILATATION & CURETTAGE/HYSTEROSCOPY WITH MYOSURE N/A 01/10/2020   Procedure: Jackquline Denmark WITH ENDOMETRIAL SAMPLING;  Surgeon: Carver Fila, MD;  Location: [facility];  Service: Gynecology;  Laterality: N/A;   HYSTEROSCOPY WITH D & C N/A 12/18/2019   Procedure: DILATATION AND CURETTAGE /HYSTEROSCOPY;  Surgeon: Adam Phenix, MD;  Location: [facility];  Service: Gynecology;  Laterality: N/A;   ROBOTIC ASSISTED TOTAL HYSTERECTOMY WITH BILATERAL SALPINGO OOPHERECTOMY N/A 01/24/2020   Procedure: XI ROBOTIC ASSISTED TOTAL HYSTERECTOMY WITH BILATERAL SALPINGO OOPHORECTOMY;  Surgeon: Carver Fila, MD;  [facility]  Service: Gynecology;  Laterality: N/A;   SENTINEL NODE BIOPSY N/A 01/24/2020   Procedure: SENTINEL NODE INJECTION BIOPSY, BILATERAL PELVIC LYMPH NODE DISSECTION;  Surgeon: Carver Fila, MD;  Location: [facility];  Service: Gynecology;   Laterality: N/A;   TONSILLECTOMY  age 50   Family History  Problem Relation Age of Onset   Breast cancer Neg Hx    Ovarian cancer Neg Hx    Endometrial cancer Neg Hx    Colon cancer Neg Hx    Social History   Socioeconomic History   Marital status: Single    Spouse name: Not on file   Number of children: 0   Years of education: 17   Highest education level: Not on file  Occupational History   Not on file  Tobacco Use   Smoking status: Never   Smokeless tobacco: Never  Vaping Use   Vaping status: Never Used  Substance and Sexual Activity   Alcohol use: Never    Alcohol/week: 1.0 standard drink of alcohol    Types: 1 Glasses of wine per week   Drug use: Never   Sexual activity: Not Currently  Other Topics Concern   Not on file  Social History Narrative   Not on file   Social Drivers of Health   Financial Resource Strain: Low Risk  (09/30/2023)   Overall Financial Resource Strain (CARDIA)    Difficulty of Paying Living Expenses: Not hard at all  Food Insecurity: No Food Insecurity (09/30/2023)   Hunger Vital Sign    Worried About Running Out of Food in the Last Year: Never true    Ran Out of Food in the Last Year: Never true  Transportation Needs: No Transportation Needs (09/30/2023)   PRAPARE - Administrator, Civil Service (Medical): No    Lack of Transportation (Non-Medical): No  Physical Activity: Sufficiently Active (09/30/2023)   Exercise Vital Sign    Days of Exercise per Week: 5 days    Minutes of Exercise per Session: 60 min  Stress: No Stress Concern Present (09/30/2023)   [employer] of Occupational Health - Occupational Stress Questionnaire    Feeling of Stress : Not at all  Social Connections: Unknown (09/30/2023)   Social Connection and Isolation Panel [NHANES]    Frequency of Communication with Friends and Family: More than three times a week    Frequency of Social Gatherings with Friends and Family: More than three times a week     Attends Religious Services: More than 4 times per year    Active Member of [employer] or Organizations: Yes    Attends Engineer, structural: More than 4 times per year    Marital Status: Not on file    Tobacco Counseling Counseling given: Not Answered   Clinical Intake:  Pre-visit preparation completed: Yes  Pain : No/denies pain    BMI - recorded: 28.69 Nutritional Status: BMI 25 -29 Overweight Nutritional Risks: None Diabetes: No  Lab Results  Component Value Date   HGBA1C 5.1 09/11/2019     How often do you need to have someone help you when you read instructions, pamphlets, or other written materials from your doctor or pharmacy?: 1 - Never  Interpreter Needed?: No  Comments: has housemate Information entered by :: B.Taleya Whitcher,LPN   Activities of Daily Living     09/30/2023    7:44 AM  In your present state of health,  do you have any difficulty performing the following activities:  Hearing? 0  Vision? 0  Difficulty concentrating or making decisions? 0  Walking or climbing stairs? 0  Dressing or bathing? 0  Doing errands, shopping? 0  Preparing Food and eating ? N  Using the Toilet? N  In the past six months, have you accidently leaked urine? N  Do you have problems with loss of bowel control? N  Managing your Medications? N  Managing your Finances? N  Housekeeping or managing your Housekeeping? N    Patient Care Team: Excell Seltzer, MD as PCP - General (Family Medicine) Carver Fila, MD as Consulting Physician (Gynecologic Oncology) Doylene Bode, NP as Nurse Practitioner (Gynecologic Oncology)  Indicate any recent Medical Services you may have received from other than [facility] providers in the past year (date may be approximate).     Assessment:   This is a routine wellness examination for Yasmeen.  Hearing/Vision screen Hearing Screening - Comments:: Pt says her hearing is too good Vision Screening - Comments:: Pt says her vision is  good;readers only Dr Alvester Morin Dr Beavis-cataract surgery   Goals Addressed             This Visit's Progress    Patient Stated   On track    09/30/23- I will continue to exercise for 75 minutes twice weekly.      Patient Stated   On track    09/30/2023, I will continue to walk 2 miles everyday.      Patient Stated   On track    09/30/23-Would like to maintain current routine     Patient Stated   On track    09/30/23-Lose 5-10 lbs, get 7000-10000 steps a day.       Depression Screen     09/30/2023   10:19 AM 09/28/2022    2:35 PM 09/12/2021    1:58 PM 09/12/2021    1:57 PM 09/05/2021   12:01 PM 11/22/2019   10:22 AM 10/26/2019   11:36 AM  PHQ 2/9 Scores  PHQ - 2 Score 0 0 0 0 0 0 0  PHQ- 9 Score   0   0 0    Fall Risk     09/30/2023    7:44 AM 09/28/2022    2:38 PM 09/12/2021    1:57 PM 09/05/2021   12:00 PM 09/12/2019    9:48 AM  Fall Risk   Falls in the past year? 0 0 0 0 0  Number falls in past yr:  0 0 0 0  Injury with Fall?  0 0 0 0  Risk for fall due to : No Fall Risks No Fall Risks  No Fall Risks Medication side effect  Follow up Education provided;Falls prevention discussed Falls prevention discussed;Falls evaluation completed Falls evaluation completed Falls prevention discussed Falls evaluation completed;Falls prevention discussed    MEDICARE RISK AT HOME:  Medicare Risk at Home Any stairs in or around the home?: (Patient-Rptd) Yes If so, are there any without handrails?: (Patient-Rptd) No Home free of loose throw rugs in walkways, pet beds, electrical cords, etc?: (Patient-Rptd) Yes Adequate lighting in your home to reduce risk of falls?: (Patient-Rptd) Yes Life alert?: (Patient-Rptd) No Use of a cane, walker or w/c?: (Patient-Rptd) No Grab bars in the bathroom?: (Patient-Rptd) Yes Shower chair or bench in shower?: (Patient-Rptd) Yes Elevated toilet seat or a handicapped toilet?: (Patient-Rptd) Yes  TIMED UP AND GO:  Was the test performed?  No  Cognitive  Function: 6CIT completed    09/12/2019    9:52 AM 09/02/2018    4:12 PM  MMSE - Mini Mental State Exam  Orientation to time 5 5  Orientation to Place 5 5  Registration 3 3  Attention/ Calculation 5 0  Recall 3 3  Language- name 2 objects  0  Language- repeat 1 1  Language- follow 3 step command  3  Language- read & follow direction  0  Write a sentence  0  Copy design  0  Total score  20        09/30/2023   10:29 AM 09/28/2022    2:39 PM  6CIT Screen  What Year? 0 points 0 points  What month? 0 points 0 points  What time? 0 points 0 points  Count back from 20 0 points 0 points  Months in reverse 0 points 0 points  Repeat phrase 0 points 0 points  Total Score 0 points 0 points    Immunizations Immunization History  Administered Date(s) Administered   PFIZER(Purple Top)SARS-COV-2 Vaccination 08/11/2019, 09/05/2019, 03/31/2020   PNEUMOCOCCAL CONJUGATE-20 09/12/2021   Zoster Recombinant(Shingrix) 11/27/2021, 03/10/2022    Screening Tests Health Maintenance  Topic Date Due   DTaP/Tdap/Td (1 - Tdap) Never done   INFLUENZA VACCINE  Never done   COVID-19 Vaccine (4 - 2024-25 season) 03/07/2023   DEXA SCAN  10/09/2023 (Originally 06/29/2012)   Medicare Annual Wellness (AWV)  09/29/2024   Pneumonia Vaccine 64+ Years old  Completed   Hepatitis C Screening  Completed   Zoster Vaccines- Shingrix  Completed   HPV VACCINES  Aged Out    Health Maintenance  Health Maintenance Due  Topic Date Due   DTaP/Tdap/Td (1 - Tdap) Never done   INFLUENZA VACCINE  Never done   COVID-19 Vaccine (4 - 2024-25 season) 03/07/2023   Health Maintenance Items Addressed: None needed  Additional Screening:  Vision Screening: Recommended annual ophthalmology exams for early detection of glaucoma and other disorders of the eye.  Dental Screening: Recommended annual dental exams for proper oral hygiene  Community Resource Referral / Chronic Care Management: CRR required this visit?  No    CCM required this visit?  No   Plan:     I have personally reviewed and noted the following in the patient's chart:   Medical and social history Use of alcohol, tobacco or illicit drugs  Current medications and supplements including opioid prescriptions. Patient is not currently taking opioid prescriptions. Functional ability and status Nutritional status Physical activity Advanced directives List of other physicians Hospitalizations, surgeries, and ER visits in previous 12 months Vitals Screenings to include cognitive, depression, and falls Referrals and appointments  In addition, I have reviewed and discussed with patient certain preventive protocols, quality metrics, and best practice recommendations. A written personalized care plan for preventive services as well as general preventive health recommendations were provided to patient.    Sue Lush, LPN   1/61/0960   After Visit Summary: (MyChart) Due to this being a telephonic visit, the after visit summary with patients personalized plan was offered to patient via MyChart   Notes: Nothing significant to report at this time.

## 2023-10-11 ENCOUNTER — Other Ambulatory Visit (INDEPENDENT_AMBULATORY_CARE_PROVIDER_SITE_OTHER): Payer: Medicare PPO

## 2023-10-11 ENCOUNTER — Encounter: Payer: Self-pay | Admitting: Family Medicine

## 2023-10-11 DIAGNOSIS — E038 Other specified hypothyroidism: Secondary | ICD-10-CM | POA: Diagnosis not present

## 2023-10-11 DIAGNOSIS — E7800 Pure hypercholesterolemia, unspecified: Secondary | ICD-10-CM | POA: Diagnosis not present

## 2023-10-11 LAB — COMPREHENSIVE METABOLIC PANEL WITH GFR
ALT: 11 U/L (ref 0–35)
AST: 13 U/L (ref 0–37)
Albumin: 4.4 g/dL (ref 3.5–5.2)
Alkaline Phosphatase: 62 U/L (ref 39–117)
BUN: 12 mg/dL (ref 6–23)
CO2: 25 meq/L (ref 19–32)
Calcium: 9.6 mg/dL (ref 8.4–10.5)
Chloride: 105 meq/L (ref 96–112)
Creatinine, Ser: 0.64 mg/dL (ref 0.40–1.20)
GFR: 85.93 mL/min (ref >60.00)
Glucose, Bld: 101 mg/dL — ABNORMAL HIGH (ref 70–99)
Potassium: 4.2 meq/L (ref 3.5–5.1)
Sodium: 140 meq/L (ref 135–145)
Total Bilirubin: 0.7 mg/dL (ref 0.2–1.2)
Total Protein: 7.3 g/dL (ref 6.0–8.3)

## 2023-10-11 LAB — LIPID PANEL
Cholesterol: 265 mg/dL — ABNORMAL HIGH (ref 0–200)
HDL: 66.2 mg/dL (ref >39.00)
LDL Cholesterol: 165 mg/dL — ABNORMAL HIGH (ref 0–99)
NonHDL: 198.43
Total CHOL/HDL Ratio: 4
Triglycerides: 166 mg/dL — ABNORMAL HIGH (ref 0.0–149.0)
VLDL: 33.2 mg/dL (ref 0.0–40.0)

## 2023-10-11 LAB — T3, FREE: T3, Free: 4 pg/mL (ref 2.3–4.2)

## 2023-10-11 LAB — T4, FREE: Free T4: 0.62 ng/dL (ref 0.60–1.60)

## 2023-10-11 LAB — TSH: TSH: 9.1 u[IU]/mL — ABNORMAL HIGH (ref 0.35–5.50)

## 2023-10-11 NOTE — Progress Notes (Signed)
 No critical labs need to be addressed urgently. We will discuss labs in detail at upcoming office visit.

## 2023-10-14 ENCOUNTER — Inpatient Hospital Stay: Payer: Medicare PPO | Attending: Gynecologic Oncology | Admitting: Gynecologic Oncology

## 2023-10-14 ENCOUNTER — Encounter: Payer: Self-pay | Admitting: Gynecologic Oncology

## 2023-10-14 VITALS — BP 142/80 | HR 102 | Temp 98.0°F | Resp 19 | Wt 181.6 lb

## 2023-10-14 DIAGNOSIS — Z90710 Acquired absence of both cervix and uterus: Secondary | ICD-10-CM | POA: Diagnosis not present

## 2023-10-14 DIAGNOSIS — Z8542 Personal history of malignant neoplasm of other parts of uterus: Secondary | ICD-10-CM | POA: Insufficient documentation

## 2023-10-14 DIAGNOSIS — Z08 Encounter for follow-up examination after completed treatment for malignant neoplasm: Secondary | ICD-10-CM

## 2023-10-14 DIAGNOSIS — Z90722 Acquired absence of ovaries, bilateral: Secondary | ICD-10-CM | POA: Insufficient documentation

## 2023-10-14 DIAGNOSIS — Z9079 Acquired absence of other genital organ(s): Secondary | ICD-10-CM | POA: Diagnosis not present

## 2023-10-14 DIAGNOSIS — C541 Malignant neoplasm of endometrium: Secondary | ICD-10-CM

## 2023-10-14 NOTE — Patient Instructions (Signed)
 It was good to see you today.  I do not see or feel any evidence of cancer recurrence on your exam.  I will see you for follow-up in 6 months.  As always, if you develop any new and concerning symptoms before your next visit, please call to see me sooner.

## 2023-10-14 NOTE — Progress Notes (Signed)
 Gynecologic Oncology Return Clinic Visit  10/14/23  Reason for Visit: surveillance  Treatment History: Oncology History Overview Note  IHC MMR normal MSI-stable   History of endometrial cancer  10/26/2019 Initial Biopsy   EMB: A. ENDOMETRIUM, BIOPSY:  - Scant benign endocervical glandular type epithelium present in the  background of a majority of blood.  - No distinct endometrial tissue present for evaluation.    12/18/2019 Surgery   D&C: A. ENDOMETRIUM, CURETTAGE:  -  Benign squamous and endocervical glandular epithelium with  microglandular adenosis  -  Benign lower uterine segment  -  No malignancy identified  -  See comment    01/10/2020 Surgery   Hysteroscopic endometrial mass resection: A. ENDOMETRIUM, RESECTION:  - Complex atypical hyperplasia, at least partially involving an  endometrial polyp.   01/24/2020 Surgery   TRH/BSO, SLN injection, bilateral pelvic LND  On EUA, small mobile uterus. On itnra-abdominal entry, normal upper abdominal survey, stomach, small and large bowel, and omentum. Normal appearing bilateral adnexa. Uterus 8cm. Hemosiderin staining along left pelvis c/w history of endometriosis. Mapping to channels that met on the right external iliac artery - no clearly lymphatic tissue. Mapping along the broad ligament on the left without obvious SLN.  No intra-abdominal or pelvic evidence of disease.    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            Tumor invades more than one half of the myometrium.            Leiomyoma.            See oncology table.       Serosa: Unremarkable. No malignancy.  - Cervix: Benign squamous and endocervical mucosa. No dysplasia or  malignancy.  - Bilateral ovaries: Benign serous cystadenoma. No malignancy.  - Bilateral  fallopian tubes: Adhesions. No malignancy.   B. SOFT TISSUE, RIGHT EXTERNAL ILIAC, BIOPSY:  - Benign soft tissue.  - No lymphoid tissue identified.   C. LYMPH NODES, RIGHT PELVIC, REGIONAL SECTION:  - Six of six lymph nodes negative for malignancy (0/6).   D. LYMPH NODES, LEFT PELVIC, REGIONAL SECTION:  - Nine of nine lymph nodes negative for malignancy (0/9).    ONCOLOGY TABLE:   UTERUS, CARCINOMA OR CARCINOSARCOMA   Procedure: Total hysterectomy and bilateral salpingo-oophorectomy with  lymph node resections  Histologic type: Endometrioid adenocarcinoma  Histologic Grade: FIGO grade I  Myometrial invasion:       Depth of invasion: 8 mm       Myometrial thickness: 12 mm  Uterine Serosa Involvement: Not identified  Cervical stromal involvement: Not identified  Extent of involvement of other organs: Not identified  Lymphovascular invasion: Not identified  Regional Lymph Nodes:       Examined:       0 Sentinel                               15 non-sentinel                               15 total        Lymph nodes with metastasis: 0        Isolated tumor cells (<0.2  mm): 0        Micrometastasis:  (>0.2 mm and < 2.0 mm): 0        Macrometastasis: (>2.0 mm): 0        Extracapsular extension: Not applicable  Representative Tumor Block: A5  MMR / MSI testing: Will be ordered    01/24/2020 Pathologic Stage   Stage IB, grade 1 No LVSI, negative LNDs   01/24/2020 Cancer Staging   Staging form: Corpus Uteri - Carcinoma and Carcinosarcoma, AJCC 8th Edition - Clinical stage from 01/24/2020: FIGO Stage IB (cT1b, cN0, cM0) - Signed by Carver Fila, MD on 01/31/2020     Interval History: Doing well.  Denies any vaginal bleeding or pelvic pain.  Reports baseline bowel bladder function.  The 43 year old sister is in hospice, requiring around-the-clock care.  Past Medical/Surgical History: Past Medical History:  Diagnosis Date   Abnormal uterine bleeding (AUB)    Anxiety     very anxious in a medical setting   Cataract 2019   bilateral eyes; corrected with surgery   Fall 12/2015   multiple falls in week    Hypertension    Hypothyroidism     Past Surgical History:  Procedure Laterality Date   APPENDECTOMY  age 22   CATARACT EXTRACTION W/ INTRAOCULAR LENS  IMPLANT, BILATERAL  2019   DILATATION & CURETTAGE/HYSTEROSCOPY WITH MYOSURE N/A 01/10/2020   Procedure: Jackquline Denmark WITH ENDOMETRIAL SAMPLING;  Surgeon: Carver Fila, MD;  Location[facility];  Service: Gynecology;  Laterality: N/A;   HYSTEROSCOPY WITH D & C N/A 12/18/2019   Procedure: DILATATION AND CURETTAGE /HYSTEROSCOPY;  Surgeon: Adam Phenix, MD;  [facility];  Service: Gynecology;  Laterality: N/A;   ROBOTIC ASSISTED TOTAL HYSTERECTOMY WITH BILATERAL SALPINGO OOPHERECTOMY N/A 01/24/2020   Procedure: XI ROBOTIC ASSISTED TOTAL HYSTERECTOMY WITH BILATERAL SALPINGO OOPHORECTOMY;  Surgeon: Carver Fila, MD;  Location: [facility];  Service: Gynecology;  Laterality: N/A;   SENTINEL NODE BIOPSY N/A 01/24/2020   Procedure: SENTINEL NODE INJECTION BIOPSY, BILATERAL PELVIC LYMPH NODE DISSECTION;  Surgeon: Carver Fila, MD;  Location[facility];  Service: Gynecology;  Laterality: N/A;   TONSILLECTOMY  age 33    Family History  Problem Relation Age of Onset   Breast cancer Neg Hx    Ovarian cancer Neg Hx    Endometrial cancer Neg Hx    Colon cancer Neg Hx     Social History   Socioeconomic History   Marital status: Single    Spouse name: Not on file   Number of children: 0   Years of education: 17   Highest education level: Not on file  Occupational History   Not on file  Tobacco Use   Smoking status: Never   Smokeless tobacco: Never  Vaping Use   Vaping status: Never Used  Substance and Sexual Activity   Alcohol use: Never    Alcohol/week: 1.0 standard drink of alcohol    Types: 1 Glasses of wine per  week   Drug use: Never   Sexual activity: Not Currently  Other Topics Concern   Not on file  Social History Narrative   Not on file   Social Drivers of Health   Financial Resource Strain: Low Risk  (09/30/2023)   Overall Financial Resource Strain (CARDIA)    Difficulty of Paying Living Expenses: Not hard at all  Food Insecurity: No Food Insecurity (09/30/2023)   Hunger Vital Sign    Worried  About Running Out of Food in the Last Year: Never true    Ran Out of Food in the Last Year: Never true  Transportation Needs: No Transportation Needs (09/30/2023)   PRAPARE - Administrator, Civil Service (Medical): No    Lack of Transportation (Non-Medical): No  Physical Activity: Sufficiently Active (09/30/2023)   Exercise Vital Sign    Days of Exercise per Week: 5 days    Minutes of Exercise per Session: 60 min  Stress: No Stress Concern Present (09/30/2023)   [employer] of Occupational Health - Occupational Stress Questionnaire    Feeling of Stress : Not at all  Social Connections: Unknown (09/30/2023)   Social Connection and Isolation Panel [NHANES]    Frequency of Communication with Friends and Family: More than three times a week    Frequency of Social Gatherings with Friends and Family: More than three times a week    Attends Religious Services: More than 4 times per year    Active Member of [employer] or Organizations: Yes    Attends Engineer, structural: More than 4 times per year    Marital Status: Not on file    Current Medications:  Current Outpatient Medications:    amLODipine (NORVASC) 10 MG tablet, TAKE 1 TABLET(10 MG) BY MOUTH DAILY, Disp: 90 tablet, Rfl: 3  Review of Systems: Denies appetite changes, fevers, chills, fatigue, unexplained weight changes. Denies hearing loss, neck lumps or masses, mouth sores, ringing in ears or voice changes. Denies cough or wheezing.  Denies shortness of breath. Denies chest pain or palpitations. Denies leg  swelling. Denies abdominal distention, pain, blood in stools, constipation, diarrhea, nausea, vomiting, or early satiety. Denies pain with intercourse, dysuria, frequency, hematuria or incontinence. Denies hot flashes, pelvic pain, vaginal bleeding or vaginal discharge.   Denies joint pain, back pain or muscle pain/cramps. Denies itching, rash, or wounds. Denies dizziness, headaches, numbness or seizures. Denies swollen lymph nodes or glands, denies easy bruising or bleeding. Denies anxiety, depression, confusion, or decreased concentration.  Physical Exam: BP (!) 177/96 (BP Location: Left Arm, Patient Position: Sitting) Comment: Notified RN  Pulse (!) 117   Temp 98 F (36.7 C) (Oral)   Resp 19   Wt 181 lb 9.6 oz (82.4 kg)   SpO2 97%   BMI 29.31 kg/m  General: Alert, oriented, no acute distress. HEENT: Normocephalic, atraumatic, sclera anicteric. Chest: Clear to auscultation bilaterally.  No wheezes or rhonchi. Cardiovascular: Regular rate and rhythm, no murmurs. Abdomen: soft, nontender.  Normoactive bowel sounds.  No masses or hepatosplenomegaly appreciated.  Well-healed incisions. Extremities: Grossly normal range of motion.  Warm, well perfused.  No edema bilaterally. Skin: No rashes or lesions noted. Lymphatics: No cervical, supraclavicular, or inguinal adenopathy. GU: Normal appearing external genitalia without erythema, excoriation, or lesions. Speculum exam reveals moderately atrophic vaginal mucosa, no bleeding, discharge, or masses noted.  Along the cuff and left sidewall, there is some more prominent almost glandular appearing tissue, unchanged since last visit along the anterior vagina at the cuff (previously biopsied).  Cuff intact.  Bimanual exam reveals cuff intact, no masses or nodularity.    Laboratory & Radiologic Studies: None new  Assessment & Plan: Betty Sanchez is a 78 y.o. 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 S/p definitive surgery 01/2020. MMR intact, MS stable. 04/2023 - biopsy along left vaginal cuff with epithelium with lymphocytic inflammation, subepithelial fibrosis and vascular hypertrophy.  No dysplasia or malignancy.  Betty Sanchez continues to do very well.  NED on exam today.   NCCN surveillance guidelines recommend visits every 6 months for 5 years. Patient is aware of signs and symptoms to call if she develops between visits.  20 minutes of total time was spent for this patient encounter, including preparation, face-to-face counseling with the patient and coordination of care, and documentation of the encounter.  Eugene Garnet, MD  Division of Gynecologic Oncology  Department of Obstetrics and Gynecology  [facility]

## 2023-10-15 ENCOUNTER — Ambulatory Visit (INDEPENDENT_AMBULATORY_CARE_PROVIDER_SITE_OTHER): Payer: Medicare PPO | Admitting: Family Medicine

## 2023-10-15 ENCOUNTER — Encounter: Payer: Self-pay | Admitting: Family Medicine

## 2023-10-15 VITALS — BP 140/70 | HR 92 | Temp 97.8°F | Ht 64.25 in | Wt 182.0 lb

## 2023-10-15 DIAGNOSIS — Z0000 Encounter for general adult medical examination without abnormal findings: Secondary | ICD-10-CM

## 2023-10-15 DIAGNOSIS — E7800 Pure hypercholesterolemia, unspecified: Secondary | ICD-10-CM | POA: Diagnosis not present

## 2023-10-15 DIAGNOSIS — Z8542 Personal history of malignant neoplasm of other parts of uterus: Secondary | ICD-10-CM | POA: Diagnosis not present

## 2023-10-15 DIAGNOSIS — H1013 Acute atopic conjunctivitis, bilateral: Secondary | ICD-10-CM | POA: Insufficient documentation

## 2023-10-15 DIAGNOSIS — E038 Other specified hypothyroidism: Secondary | ICD-10-CM

## 2023-10-15 DIAGNOSIS — I10 Essential (primary) hypertension: Secondary | ICD-10-CM

## 2023-10-15 NOTE — Assessment & Plan Note (Signed)
ONC Dr. Berline Lopes ? Active surveillance. ?

## 2023-10-15 NOTE — Assessment & Plan Note (Signed)
 Acute  due to pollen... will call if not improving and if interested in olopatadine drop.

## 2023-10-15 NOTE — Assessment & Plan Note (Signed)
Stable, chronic.  Continue current medication. ? ? ? amlodipine 10 daily ?

## 2023-10-15 NOTE — Assessment & Plan Note (Signed)
Chronic , stable control on  No medicaiton. Free T3 and free T4 remain in normal range.

## 2023-10-15 NOTE — Assessment & Plan Note (Signed)
Chronic, LDL not at goal  She continues to not be interested in statin treatment. We did discuss calcium cardiac scoring as an option to determine her risk more specifically for coronary artery disease. She will consider this and let me know.  Will continue to work on healthy lifestyle changes

## 2023-10-15 NOTE — Progress Notes (Signed)
 Patient ID: Betty Sanchez, female    DOB: 02-21-1946, 78 y.o.   MRN: [id number]  This visit was conducted in person.  BP (!) 140/70 (BP Location: Right Arm, Patient Position: Sitting, Cuff Size: Large)   Pulse 92   Temp 97.8 F (36.6 C) (Temporal)   Ht 5' 4.25" (1.632 m)   Wt 182 lb (82.6 kg)   SpO2 98%   BMI 31.00 kg/m    CC: Chief Complaint  Patient presents with   Annual Exam    Part 2 (MWV 09/30/23)    Subjective:   HPI: Betty Sanchez is a 78 y.o. female presenting on 10/15/2023 for Annual Exam (Part 2 (MWV 09/30/23))  The patient presents for complete physical and review of chronic health problems. He/She also has the following acute concerns today: none   Sister in hospice... pt is a caregiver... increased stress, eating less healthy.  The patient saw a LPN or RN for medicare wellness visit. ON 09/30/2023   Prevention and wellness was reviewed in detail. Note reviewed and important notes copied below.  Hx of endometrial cancer S/P TRH/SBO 01/2020. She opted against radiation followed by active surveillance by Dr. Pricilla Holm at [facility] q6 months  Elevated Cholesterol: Inadequate control of cholesterol,On natural cholesterol medication treatment. Statin is indicated.. she continues to not be interested. Lab Results  Component Value Date   CHOL 265 (H) 10/11/2023   HDL 66.20 10/11/2023   LDLCALC 165 (H) 10/11/2023   TRIG 166.0 (H) 10/11/2023   CHOLHDL 4 10/11/2023  The 10-year ASCVD risk score (Arnett DK, et al., 2019) is: 27.8%   Values used to calculate the score:     Age: 10 years     Sex: Female     Is Non-Hispanic African American: No     Diabetic: No     Tobacco smoker: No     Systolic Blood Pressure: 140 mmHg     Is BP treated: Yes     HDL Cholesterol: 66.2 mg/dL     Total Cholesterol: 265 mg/dL Using medications without problems: Muscle aches:  Diet compliance:  Heart healthy Exercise: 7000-10000 steps a day, weight training, upper body Other  complaints:  Hypertension: Well-controlled on Norvasc 10 mg daily.. some element of white coat HTN BP Readings from Last 3 Encounters:  10/15/23 (!) 140/70  10/14/23 (!) 142/80  04/16/23 (!) 142/82  Using medication without problems or lightheadedness:  none Chest pain with exertion: none Edema:none Short of breath:none Average home BPs: 112-130/72-84 Other issues:   Subclinical hypothyroidism: TSH elevated but free T3 and free T4 in normal range.  She states felt  worse on levothyroxine in the past. Lab Results  Component Value Date   TSH 9.10 (H) 10/11/2023    Wt Readings from Last 3 Encounters:  10/15/23 182 lb (82.6 kg)  10/14/23 181 lb 9.6 oz (82.4 kg)  09/30/23 179 lb (81.2 kg)      Followed by [facility] for endometrial cancer.  Relevant past medical, surgical, family and social history reviewed and updated as indicated. Interim medical history since our last visit reviewed. Allergies and medications reviewed and updated. Outpatient Medications Prior to Visit  Medication Sig Dispense Refill   amLODipine (NORVASC) 10 MG tablet TAKE 1 TABLET(10 MG) BY MOUTH DAILY 90 tablet 3   No facility-administered medications prior to visit.     Per HPI unless specifically indicated in ROS section below Review of Systems  Constitutional:  Negative for fatigue and fever.  HENT:  Negative  for congestion.   Eyes:  Negative for pain.  Respiratory:  Negative for cough and shortness of breath.   Cardiovascular:  Negative for chest pain, palpitations and leg swelling.  Gastrointestinal:  Negative for abdominal pain.  Genitourinary:  Negative for dysuria and vaginal bleeding.  Musculoskeletal:  Negative for back pain.  Neurological:  Negative for syncope, light-headedness and headaches.  Psychiatric/Behavioral:  Negative for dysphoric mood.    Objective:  BP (!) 140/70 (BP Location: Right Arm, Patient Position: Sitting, Cuff Size: Large)   Pulse 92   Temp 97.8 F (36.6 C) (Temporal)    Ht 5' 4.25" (1.632 m)   Wt 182 lb (82.6 kg)   SpO2 98%   BMI 31.00 kg/m   Wt Readings from Last 3 Encounters:  10/15/23 182 lb (82.6 kg)  10/14/23 181 lb 9.6 oz (82.4 kg)  09/30/23 179 lb (81.2 kg)      Physical Exam Constitutional:      General: She is not in acute distress.    Appearance: Normal appearance. She is well-developed. She is not ill-appearing or toxic-appearing.  HENT:     Head: Normocephalic.     Right Ear: Hearing, tympanic membrane, ear canal and external ear normal. Tympanic membrane is not erythematous, retracted or bulging.     Left Ear: Hearing, tympanic membrane, ear canal and external ear normal. Tympanic membrane is not erythematous, retracted or bulging.     Nose: No mucosal edema or rhinorrhea.     Right Sinus: No maxillary sinus tenderness or frontal sinus tenderness.     Left Sinus: No maxillary sinus tenderness or frontal sinus tenderness.     Mouth/Throat:     Pharynx: Uvula midline.  Eyes:     General: Lids are normal. Lids are everted, no foreign bodies appreciated.     Conjunctiva/sclera: Conjunctivae normal.     Pupils: Pupils are equal, round, and reactive to light.  Neck:     Thyroid: No thyroid mass or thyromegaly.     Vascular: No carotid bruit.     Trachea: Trachea normal.  Cardiovascular:     Rate and Rhythm: Normal rate and regular rhythm.     Pulses: Normal pulses.     Heart sounds: Normal heart sounds, S1 normal and S2 normal. No murmur heard.    No friction rub. No gallop.  Pulmonary:     Effort: Pulmonary effort is normal. No tachypnea or respiratory distress.     Breath sounds: Normal breath sounds. No decreased breath sounds, wheezing, rhonchi or rales.  Abdominal:     General: Bowel sounds are normal.     Palpations: Abdomen is soft.     Tenderness: There is no abdominal tenderness.  Musculoskeletal:     Cervical back: Normal range of motion and neck supple.  Skin:    General: Skin is warm and dry.     Findings: No rash.   Neurological:     Mental Status: She is alert.  Psychiatric:        Mood and Affect: Mood is not anxious or depressed.        Speech: Speech normal.        Behavior: Behavior normal. Behavior is cooperative.        Thought Content: Thought content normal.        Judgment: Judgment normal.       Results for orders placed or performed in visit on 10/11/23  TSH   Collection Time: 10/11/23  8:06 AM  Result Value  Ref Range   TSH 9.10 (H) 0.35 - 5.50 uIU/mL  T3, free   Collection Time: 10/11/23  8:06 AM  Result Value Ref Range   T3, Free 4.0 2.3 - 4.2 pg/mL  T4, free   Collection Time: 10/11/23  8:06 AM  Result Value Ref Range   Free T4 0.62 0.60 - 1.60 ng/dL  Lipid panel   Collection Time: 10/11/23  8:06 AM  Result Value Ref Range   Cholesterol 265 (H) 0 - 200 mg/dL   Triglycerides 469.6 (H) 0.0 - 149.0 mg/dL   HDL 29.52 >84.13 mg/dL   VLDL 24.4 0.0 - 01.0 mg/dL   LDL Cholesterol 272 (H) 0 - 99 mg/dL   Total CHOL/HDL Ratio 4    NonHDL 198.43   Comprehensive metabolic panel   Collection Time: 10/11/23  8:06 AM  Result Value Ref Range   Sodium 140 135 - 145 mEq/L   Potassium 4.2 3.5 - 5.1 mEq/L   Chloride 105 96 - 112 mEq/L   CO2 25 19 - 32 mEq/L   Glucose, Bld 101 (H) 70 - 99 mg/dL   BUN 12 6 - 23 mg/dL   Creatinine, Ser 5.36 0.40 - 1.20 mg/dL   Total Bilirubin 0.7 0.2 - 1.2 mg/dL   Alkaline Phosphatase 62 39 - 117 U/L   AST 13 0 - 37 U/L   ALT 11 0 - 35 U/L   Total Protein 7.3 6.0 - 8.3 g/dL   Albumin 4.4 3.5 - 5.2 g/dL   GFR 64.40 >34.74 mL/min   Calcium 9.6 8.4 - 10.5 mg/dL    This visit occurred during the SARS-CoV-2 public health emergency.  Safety protocols were in place, including screening questions prior to the visit, additional usage of staff PPE, and extensive cleaning of exam room while observing appropriate contact time as indicated for disinfecting solutions.   COVID 19 screen:  No recent travel or known exposure to COVID19 The patient denies  respiratory symptoms of COVID 19 at this time. The importance of social distancing was discussed today.   Assessment and Plan The patient's preventative maintenance and recommended screening tests for an annual wellness exam were reviewed in full today. Brought up to date unless services declined.  Counselled on the importance of diet, exercise, and its role in overall health and mortality. The patient's FH and SH was reviewed, including their home life, tobacco status, and drug and alcohol status.    Vaccines: Has had 3 COVID doses,  pneumonia series Consider  Tetanus Pap/DVE: Status post total hysterectomy Mammo: Overdue,  not interested Bone Density: Overdue not interested Colon: Never had, but no longer indicated at age 19. Smoking Status: None ETOH/ drug use: occ/none  Hep C: Done    Problem List Items Addressed This Visit     Allergic conjunctivitis of both eyes    Acute  due to pollen... will call if not improving and if interested in olopatadine drop.      Essential hypertension   Stable, chronic.  Continue current medication.    amlodipine 10 daily      History of endometrial cancer   [facility] Dr. Pricilla Holm  Active surveillance.      Hypercholesteremia   Chronic, LDL not at goal  She continues to not be interested in statin treatment. We did discuss calcium cardiac scoring as an option to determine her risk more specifically for coronary artery disease. She will consider this and let me know.  Will continue to work on healthy lifestyle  changes      Subclinical hypothyroidism   Chronic , stable control on  No medicaiton. Free T3 and free T4 remain in normal range.      Other Visit Diagnoses       Routine general medical examination at a health care facility    -  Primary       Kerby Nora, MD

## 2023-10-21 DIAGNOSIS — Z961 Presence of intraocular lens: Secondary | ICD-10-CM | POA: Diagnosis not present

## 2023-11-04 DIAGNOSIS — H26491 Other secondary cataract, right eye: Secondary | ICD-10-CM | POA: Diagnosis not present

## 2023-11-04 DIAGNOSIS — H18413 Arcus senilis, bilateral: Secondary | ICD-10-CM | POA: Diagnosis not present

## 2023-11-04 DIAGNOSIS — Z961 Presence of intraocular lens: Secondary | ICD-10-CM | POA: Diagnosis not present

## 2023-11-04 DIAGNOSIS — H26493 Other secondary cataract, bilateral: Secondary | ICD-10-CM | POA: Diagnosis not present

## 2023-11-30 DIAGNOSIS — H26492 Other secondary cataract, left eye: Secondary | ICD-10-CM | POA: Diagnosis not present

## 2023-12-02 ENCOUNTER — Other Ambulatory Visit: Payer: Self-pay | Admitting: Family Medicine

## 2023-12-02 DIAGNOSIS — I10 Essential (primary) hypertension: Secondary | ICD-10-CM

## 2024-04-21 ENCOUNTER — Encounter: Payer: Self-pay | Admitting: Gynecologic Oncology

## 2024-04-21 ENCOUNTER — Inpatient Hospital Stay: Attending: Gynecologic Oncology | Admitting: Gynecologic Oncology

## 2024-04-21 VITALS — BP 144/70 | HR 100 | Temp 97.7°F | Resp 19 | Wt 180.4 lb

## 2024-04-21 DIAGNOSIS — Z90722 Acquired absence of ovaries, bilateral: Secondary | ICD-10-CM | POA: Insufficient documentation

## 2024-04-21 DIAGNOSIS — Z9079 Acquired absence of other genital organ(s): Secondary | ICD-10-CM | POA: Insufficient documentation

## 2024-04-21 DIAGNOSIS — Z8542 Personal history of malignant neoplasm of other parts of uterus: Secondary | ICD-10-CM | POA: Insufficient documentation

## 2024-04-21 DIAGNOSIS — C541 Malignant neoplasm of endometrium: Secondary | ICD-10-CM

## 2024-04-21 DIAGNOSIS — Z90710 Acquired absence of both cervix and uterus: Secondary | ICD-10-CM | POA: Insufficient documentation

## 2024-04-21 NOTE — Patient Instructions (Signed)
 It was good to see you today.  I do not see or feel any evidence of cancer recurrence on your exam.  I will see you for follow-up in 6-8 months.  As always, if you develop any new and concerning symptoms before your next visit, please call to see me sooner.

## 2024-04-21 NOTE — Progress Notes (Addendum)
 Gynecologic Oncology Return Clinic Visit  04/21/24  Reason for Visit: surveillance  Treatment History: Oncology History Overview Note  IHC MMR normal MSI-stable   History of endometrial cancer  10/26/2019 Initial Biopsy   EMB: A. ENDOMETRIUM, BIOPSY:  - Scant benign endocervical glandular type epithelium present in the  background of a majority of blood.  - No distinct endometrial tissue present for evaluation.    12/18/2019 Surgery   D&C: A. ENDOMETRIUM, CURETTAGE:  -  Benign squamous and endocervical glandular epithelium with  microglandular adenosis  -  Benign lower uterine segment  -  No malignancy identified  -  See comment    01/10/2020 Surgery   Hysteroscopic endometrial mass resection: A. ENDOMETRIUM, RESECTION:  - Complex atypical hyperplasia, at least partially involving an  endometrial polyp.   01/24/2020 Surgery   TRH/BSO, SLN injection, bilateral pelvic LND  On EUA, small mobile uterus. On itnra-abdominal entry, normal upper abdominal survey, stomach, small and large bowel, and omentum. Normal appearing bilateral adnexa. Uterus 8cm. Hemosiderin staining along left pelvis c/w history of endometriosis. Mapping to channels that met on the right external iliac artery - no clearly lymphatic tissue. Mapping along the broad ligament on the left without obvious SLN.  No intra-abdominal or pelvic evidence of disease.    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            Tumor invades more than one half of the myometrium.            Leiomyoma.            See oncology table.       Serosa: Unremarkable. No malignancy.  - Cervix: Benign squamous and endocervical mucosa. No dysplasia or  malignancy.  - Bilateral ovaries: Benign serous cystadenoma. No malignancy.  - Bilateral  fallopian tubes: Adhesions. No malignancy.   B. SOFT TISSUE, RIGHT EXTERNAL ILIAC, BIOPSY:  - Benign soft tissue.  - No lymphoid tissue identified.   C. LYMPH NODES, RIGHT PELVIC, REGIONAL SECTION:  - Six of six lymph nodes negative for malignancy (0/6).   D. LYMPH NODES, LEFT PELVIC, REGIONAL SECTION:  - Nine of nine lymph nodes negative for malignancy (0/9).    ONCOLOGY TABLE:   UTERUS, CARCINOMA OR CARCINOSARCOMA   Procedure: Total hysterectomy and bilateral salpingo-oophorectomy with  lymph node resections  Histologic type: Endometrioid adenocarcinoma  Histologic Grade: FIGO grade I  Myometrial invasion:       Depth of invasion: 8 mm       Myometrial thickness: 12 mm  Uterine Serosa Involvement: Not identified  Cervical stromal involvement: Not identified  Extent of involvement of other organs: Not identified  Lymphovascular invasion: Not identified  Regional Lymph Nodes:       Examined:       0 Sentinel                               15 non-sentinel                               15 total        Lymph nodes with metastasis: 0        Isolated tumor cells (<0.2  mm): 0        Micrometastasis:  (>0.2 mm and < 2.0 mm): 0        Macrometastasis: (>2.0 mm): 0        Extracapsular extension: Not applicable  Representative Tumor Block: A5  MMR / MSI testing: Will be ordered    01/24/2020 Pathologic Stage   Stage IB, grade 1 No LVSI, negative LNDs   01/24/2020 Cancer Staging   Staging form: Corpus Uteri - Carcinoma and Carcinosarcoma, AJCC 8th Edition - Clinical stage from 01/24/2020: FIGO Stage IB (cT1b, cN0, cM0) - Signed by Viktoria Comer SAUNDERS, MD on 01/31/2020    Interval History: Doing well.  Denies any vaginal bleeding.  Reports normal bowel and bladder function.  Looking forward to heading to [place]  after her visit today.  Past Medical/Surgical History: Past Medical History:  Diagnosis Date   Abnormal uterine bleeding (AUB)    Anxiety    very anxious in a medical  setting   Cataract 2019   bilateral eyes; corrected with surgery   Fall 12/2015   multiple falls in week    Hypertension    Hypothyroidism     Past Surgical History:  Procedure Laterality Date   APPENDECTOMY  age 44   CATARACT EXTRACTION W/ INTRAOCULAR LENS  IMPLANT, BILATERAL  2019   DILATATION & CURETTAGE/HYSTEROSCOPY WITH MYOSURE N/A 01/10/2020   Procedure: MELINDA WITH ENDOMETRIAL SAMPLING;  Surgeon: Viktoria Comer SAUNDERS, MD;  Location: [facility];  Service: Gynecology;  Laterality: N/A;   HYSTEROSCOPY WITH D & C N/A 12/18/2019   Procedure: DILATATION AND CURETTAGE /HYSTEROSCOPY;  Surgeon: Eveline Lynwood MATSU, MD;  Location:  SUR[facility]ERY CENTER;  Service: Gynecology;  Laterality: N/A;   ROBOTIC ASSISTED TOTAL HYSTERECTOMY WITH BILATERAL SALPINGO OOPHERECTOMY N/A 01/24/2020   Procedure: XI ROBOTIC ASSISTED TOTAL HYSTERECTOMY WITH BILATERAL SALPINGO OOPHORECTOMY;  Surgeon: Viktoria Comer SAUNDERS, MD;  Location[facility];  Service: Gynecology;  Laterality: N/A;   SENTINEL NODE BIOPSY N/A 01/24/2020   Procedure: SENTINEL NODE INJECTION BIOPSY, BILATERAL PELVIC LYMPH NODE DISSECTION;  Surgeon: Viktoria Comer SAUNDERS, MD;  Location: [facility] [place];  Service: Gynecology;  Laterality: N/A;   TONSILLECTOMY  age 43    Family History  Problem Relation Age of Onset   Breast cancer Neg Hx    Ovarian cancer Neg Hx    Endometrial cancer Neg Hx    Colon cancer Neg Hx     Social History   Socioeconomic History   Marital status: Single    Spouse name: Not on file   Number of children: 0   Years of education: 17   Highest education level: Not on file  Occupational History   Not on file  Tobacco Use   Smoking status: Never   Smokeless tobacco: Never  Vaping Use   Vaping status: Never Used  Substance and Sexual Activity   Alcohol use: Never    Alcohol/week: 1.0 standard drink of alcohol    Types: 1 Glasses of wine per week   Drug use: Never    Sexual activity: Not Currently  Other Topics Concern   Not on file  Social History Narrative   Not on file   Social Drivers of Health   Financial Resource Strain: Low Risk  (09/30/2023)   Overall Financial Resource Strain (CARDIA)    Difficulty of Paying Living Expenses: Not hard at all  Food Insecurity: No Food Insecurity (09/30/2023)   Hunger Vital Sign    Worried About Running  Out of Food in the Last Year: Never true    Ran Out of Food in the Last Year: Never true  Transportation Needs: No Transportation Needs (09/30/2023)   PRAPARE - Administrator, Civil Service (Medical): No    Lack of Transportation (Non-Medical): No  Physical Activity: Sufficiently Active (09/30/2023)   Exercise Vital Sign    Days of Exercise per Week: 5 days    Minutes of Exercise per Session: 60 min  Stress: No Stress Concern Present (09/30/2023)   [employer] of Occupational Health - Occupational Stress Questionnaire    Feeling of Stress : Not at all  Social Connections: Unknown (09/30/2023)   Social Connection and Isolation Panel    Frequency of Communication with Friends and Family: More than three times a week    Frequency of Social Gatherings with Friends and Family: More than three times a week    Attends Religious Services: More than 4 times per year    Active Member of [employer] or Organizations: Yes    Attends Engineer, structural: More than 4 times per year    Marital Status: Not on file    Current Medications:  Current Outpatient Medications:    amLODipine  (NORVASC ) 10 MG tablet, TAKE 1 TABLET(10 MG) BY MOUTH DAILY, Disp: 90 tablet, Rfl: 3  Review of Systems: Denies appetite changes, fevers, chills, fatigue, unexplained weight changes. Denies hearing loss, neck lumps or masses, mouth sores, ringing in ears or voice changes. Denies cough or wheezing.  Denies shortness of breath. Denies chest pain or palpitations. Denies leg swelling. Denies abdominal distention, pain,  blood in stools, constipation, diarrhea, nausea, vomiting, or early satiety. Denies pain with intercourse, dysuria, frequency, hematuria or incontinence. Denies hot flashes, pelvic pain, vaginal bleeding or vaginal discharge.   Denies joint pain, back pain or muscle pain/cramps. Denies itching, rash, or wounds. Denies dizziness, headaches, numbness or seizures. Denies swollen lymph nodes or glands, denies easy bruising or bleeding. Denies anxiety, depression, confusion, or decreased concentration.  Physical Exam: BP (!) 155/84 (BP Location: Left Arm, Patient Position: Sitting) Comment: Notified CMA  Pulse (!) 120   Temp 97.7 F (36.5 C)   Resp 19   Wt 180 lb 6.4 oz (81.8 kg)   SpO2 100%   BMI 30.73 kg/m  Repeat BP 144/70, HR 100 General: Alert, oriented, no acute distress. HEENT: Normocephalic, atraumatic, sclera anicteric. Chest: Clear to auscultation bilaterally.  No wheezes or rhonchi. Cardiovascular: Regular rate and rhythm, no murmurs. Abdomen: soft, nontender.  Normoactive bowel sounds.  No masses or hepatosplenomegaly appreciated.  Well-healed incisions. Extremities: Grossly normal range of motion.  Warm, well perfused.  No edema bilaterally. Skin: No rashes or lesions noted. Lymphatics: No cervical, supraclavicular, or inguinal adenopathy. GU: Normal appearing external genitalia without erythema, excoriation, or lesions. Speculum exam reveals moderately atrophic vaginal mucosa, no bleeding, discharge, or masses noted.  Along the cuff and left sidewall, there is some more prominent almost glandular appearing tissue, stable since last visit along the anterior vagina at the cuff (previously biopsied).  Cuff intact.  Bimanual exam reveals cuff intact, no masses or nodularity.  On rectovaginal exam, no thickening or nodularity.  Laboratory & Radiologic Studies: None new  Assessment & Plan: Betty Sanchez is a 78 y.o. 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 S/p definitive surgery 01/2020. MMR intact, MS stable. 04/2023 - biopsy along left vaginal cuff with epithelium with lymphocytic inflammation, subepithelial fibrosis and  vascular hypertrophy.  No dysplasia or malignancy.   Brandis continues to do very well.  NED on exam today.   NCCN surveillance guidelines recommend visits every 6 months for 5 years.  We will plan for her next visit closer to July next year and if she remains NED, we will plan to discharge her at that time.  We will discuss at her next visit possible referral options for a new OB/GYN.    Patient is aware of signs and symptoms to call if she develops between visits.  20 minutes of total time was spent for this patient encounter, including preparation, face-to-face counseling with the patient and coordination of care, and documentation of the encounter.  Comer Dollar, MD  Division of Gynecologic Oncology  Department of Obstetrics and Gynecology  [facility]

## 2024-10-03 ENCOUNTER — Ambulatory Visit

## 2024-10-27 ENCOUNTER — Inpatient Hospital Stay: Admitting: Gynecologic Oncology

## 2024-11-08 ENCOUNTER — Other Ambulatory Visit

## 2024-11-08 ENCOUNTER — Ambulatory Visit

## 2024-11-09 ENCOUNTER — Other Ambulatory Visit

## 2024-11-15 ENCOUNTER — Encounter: Admitting: Family Medicine

## 2024-12-29 ENCOUNTER — Inpatient Hospital Stay: Admitting: Gynecologic Oncology
